# Patient Record
Sex: Female | Born: 1965 | Race: Black or African American | Hispanic: No | Smoking: Never smoker
Health system: Southern US, Community
[De-identification: ages and names within clinical notes are randomized; demographics above are authoritative.]

## PROBLEM LIST (undated history)

## (undated) DIAGNOSIS — J45909 Unspecified asthma, uncomplicated: Secondary | ICD-10-CM

## (undated) DIAGNOSIS — E079 Disorder of thyroid, unspecified: Secondary | ICD-10-CM

## (undated) DIAGNOSIS — D649 Anemia, unspecified: Secondary | ICD-10-CM

## (undated) DIAGNOSIS — E785 Hyperlipidemia, unspecified: Secondary | ICD-10-CM

## (undated) DIAGNOSIS — T7840XA Allergy, unspecified, initial encounter: Secondary | ICD-10-CM

## (undated) DIAGNOSIS — M1990 Unspecified osteoarthritis, unspecified site: Secondary | ICD-10-CM

## (undated) DIAGNOSIS — E119 Type 2 diabetes mellitus without complications: Secondary | ICD-10-CM

## (undated) DIAGNOSIS — G8929 Other chronic pain: Secondary | ICD-10-CM

## (undated) DIAGNOSIS — M25571 Pain in right ankle and joints of right foot: Secondary | ICD-10-CM

## (undated) DIAGNOSIS — I10 Essential (primary) hypertension: Secondary | ICD-10-CM

## (undated) HISTORY — DX: Unspecified osteoarthritis, unspecified site: M19.90

## (undated) HISTORY — DX: Allergy, unspecified, initial encounter: T78.40XA

## (undated) HISTORY — PX: ABDOMINAL HYSTERECTOMY: SHX81

## (undated) HISTORY — DX: Unspecified asthma, uncomplicated: J45.909

## (undated) HISTORY — PX: MYOMECTOMY ABDOMINAL APPROACH: SUR870

## (undated) HISTORY — DX: Other chronic pain: G89.29

## (undated) HISTORY — DX: Essential (primary) hypertension: I10

## (undated) HISTORY — DX: Pain in right ankle and joints of right foot: M25.571

## (undated) HISTORY — DX: Hyperlipidemia, unspecified: E78.5

## (undated) HISTORY — PX: TONSILLECTOMY: SUR1361

## (undated) HISTORY — DX: Disorder of thyroid, unspecified: E07.9

## (undated) HISTORY — DX: Type 2 diabetes mellitus without complications: E11.9

## (undated) HISTORY — DX: Anemia, unspecified: D64.9

---

## 2001-06-05 ENCOUNTER — Emergency Department (HOSPITAL_COMMUNITY): Admission: EM | Admit: 2001-06-05 | Discharge: 2001-06-05 | Payer: Self-pay | Admitting: Emergency Medicine

## 2012-10-18 HISTORY — PX: FOOT SURGERY: SHX648

## 2016-06-23 ENCOUNTER — Ambulatory Visit (INDEPENDENT_AMBULATORY_CARE_PROVIDER_SITE_OTHER): Payer: Federal, State, Local not specified - PPO | Admitting: Nurse Practitioner

## 2016-06-23 ENCOUNTER — Encounter: Payer: Self-pay | Admitting: Nurse Practitioner

## 2016-06-23 ENCOUNTER — Other Ambulatory Visit (INDEPENDENT_AMBULATORY_CARE_PROVIDER_SITE_OTHER): Payer: Federal, State, Local not specified - PPO

## 2016-06-23 VITALS — BP 138/86 | HR 88 | Temp 98.6°F | Ht 68.0 in | Wt 279.0 lb

## 2016-06-23 DIAGNOSIS — E039 Hypothyroidism, unspecified: Secondary | ICD-10-CM

## 2016-06-23 DIAGNOSIS — R6889 Other general symptoms and signs: Secondary | ICD-10-CM

## 2016-06-23 DIAGNOSIS — I10 Essential (primary) hypertension: Secondary | ICD-10-CM | POA: Insufficient documentation

## 2016-06-23 DIAGNOSIS — Z0001 Encounter for general adult medical examination with abnormal findings: Secondary | ICD-10-CM

## 2016-06-23 DIAGNOSIS — J324 Chronic pansinusitis: Secondary | ICD-10-CM

## 2016-06-23 DIAGNOSIS — R350 Frequency of micturition: Secondary | ICD-10-CM | POA: Diagnosis not present

## 2016-06-23 DIAGNOSIS — Z0000 Encounter for general adult medical examination without abnormal findings: Secondary | ICD-10-CM | POA: Diagnosis not present

## 2016-06-23 DIAGNOSIS — E785 Hyperlipidemia, unspecified: Secondary | ICD-10-CM

## 2016-06-23 DIAGNOSIS — J4521 Mild intermittent asthma with (acute) exacerbation: Secondary | ICD-10-CM

## 2016-06-23 DIAGNOSIS — E118 Type 2 diabetes mellitus with unspecified complications: Secondary | ICD-10-CM

## 2016-06-23 DIAGNOSIS — J45901 Unspecified asthma with (acute) exacerbation: Secondary | ICD-10-CM | POA: Insufficient documentation

## 2016-06-23 LAB — CBC WITH DIFFERENTIAL/PLATELET
Basophils Absolute: 0 10*3/uL (ref 0.0–0.1)
Basophils Relative: 0.7 % (ref 0.0–3.0)
EOS PCT: 3.5 % (ref 0.0–5.0)
Eosinophils Absolute: 0.2 10*3/uL (ref 0.0–0.7)
HEMATOCRIT: 41.8 % (ref 36.0–46.0)
Hemoglobin: 14.1 g/dL (ref 12.0–15.0)
LYMPHS ABS: 2 10*3/uL (ref 0.7–4.0)
Lymphocytes Relative: 45.2 % (ref 12.0–46.0)
MCHC: 33.8 g/dL (ref 30.0–36.0)
MCV: 76.7 fl — AB (ref 78.0–100.0)
MONOS PCT: 7 % (ref 3.0–12.0)
Monocytes Absolute: 0.3 10*3/uL (ref 0.1–1.0)
NEUTROS ABS: 1.9 10*3/uL (ref 1.4–7.7)
NEUTROS PCT: 43.6 % (ref 43.0–77.0)
PLATELETS: 290 10*3/uL (ref 150.0–400.0)
RBC: 5.46 Mil/uL — ABNORMAL HIGH (ref 3.87–5.11)
RDW: 14.8 % (ref 11.5–15.5)
WBC: 4.4 10*3/uL (ref 4.0–10.5)

## 2016-06-23 LAB — LIPID PANEL
CHOLESTEROL: 231 mg/dL — AB (ref 0–200)
HDL: 56.9 mg/dL (ref >39.00)
LDL CALC: 149 mg/dL — AB (ref 0–99)
NonHDL: 174.58
TRIGLYCERIDES: 127 mg/dL (ref 0.0–149.0)
Total CHOL/HDL Ratio: 4
VLDL: 25.4 mg/dL (ref 0.0–40.0)

## 2016-06-23 LAB — COMPREHENSIVE METABOLIC PANEL
ALK PHOS: 68 U/L (ref 39–117)
ALT: 24 U/L (ref 0–35)
AST: 15 U/L (ref 0–37)
Albumin: 4.1 g/dL (ref 3.5–5.2)
BILIRUBIN TOTAL: 0.4 mg/dL (ref 0.2–1.2)
BUN: 14 mg/dL (ref 6–23)
CO2: 33 meq/L — AB (ref 19–32)
CREATININE: 0.82 mg/dL (ref 0.40–1.20)
Calcium: 9.7 mg/dL (ref 8.4–10.5)
Chloride: 99 mEq/L (ref 96–112)
GFR: 95.12 mL/min (ref >60.00)
GLUCOSE: 96 mg/dL (ref 70–99)
Potassium: 3.6 mEq/L (ref 3.5–5.1)
SODIUM: 138 meq/L (ref 135–145)
TOTAL PROTEIN: 7.8 g/dL (ref 6.0–8.3)

## 2016-06-23 LAB — POCT URINALYSIS DIPSTICK
Bilirubin, UA: NEGATIVE
GLUCOSE UA: NEGATIVE
Ketones, UA: NEGATIVE
LEUKOCYTES UA: NEGATIVE
NITRITE UA: NEGATIVE
Protein, UA: NEGATIVE
Spec Grav, UA: >=1.03
UROBILINOGEN UA: 0.2
pH, UA: 6

## 2016-06-23 LAB — TSH: TSH: 0.04 u[IU]/mL — ABNORMAL LOW (ref 0.35–4.50)

## 2016-06-23 LAB — HEMOGLOBIN A1C: Hgb A1c MFr Bld: 6.5 % (ref 4.6–6.5)

## 2016-06-23 LAB — T4, FREE: Free T4: 1.44 ng/dL (ref 0.60–1.60)

## 2016-06-23 MED ORDER — AMLODIPINE BESYLATE 5 MG PO TABS: 5.0000 mg | ORAL_TABLET | Freq: Every day | ORAL | 3 refills | Status: DC

## 2016-06-23 MED ORDER — LEVOTHYROXINE SODIUM 175 MCG PO TABS: 175.0000 ug | ORAL_TABLET | Freq: Every day | ORAL | 3 refills | Status: DC

## 2016-06-23 MED ORDER — FLUTICASONE PROPIONATE 50 MCG/ACT NA SUSP: 2.0000 | Freq: Every day | NASAL | 0 refills | Status: DC

## 2016-06-23 MED ORDER — HYDROCHLOROTHIAZIDE 25 MG PO TABS: 25.0000 mg | ORAL_TABLET | Freq: Every day | ORAL | 3 refills | Status: DC

## 2016-06-23 MED ORDER — DOXYCYCLINE HYCLATE 100 MG PO TABS: 100.0000 mg | ORAL_TABLET | Freq: Two times a day (BID) | ORAL | 0 refills | Status: AC

## 2016-06-23 MED ORDER — CETIRIZINE HCL 10 MG PO TABS: 10.0000 mg | ORAL_TABLET | Freq: Every day | ORAL | 0 refills | Status: DC

## 2016-06-23 MED ORDER — ATORVASTATIN CALCIUM 20 MG PO TABS: 20.0000 mg | ORAL_TABLET | Freq: Every day | ORAL | 3 refills | Status: DC

## 2016-06-23 MED ORDER — LISINOPRIL 20 MG PO TABS: 20.0000 mg | ORAL_TABLET | Freq: Every day | ORAL | 3 refills | Status: DC

## 2016-06-23 MED ORDER — GUAIFENESIN ER 600 MG PO TB12: 600.0000 mg | ORAL_TABLET | Freq: Two times a day (BID) | ORAL | 0 refills | Status: DC | PRN

## 2016-06-23 MED ORDER — BENZONATATE 100 MG PO CAPS: 100.0000 mg | ORAL_CAPSULE | Freq: Three times a day (TID) | ORAL | 0 refills | Status: DC | PRN

## 2016-06-23 MED ORDER — ALBUTEROL SULFATE HFA 108 (90 BASE) MCG/ACT IN AERS: 2.0000 | INHALATION_SPRAY | Freq: Four times a day (QID) | RESPIRATORY_TRACT | 2 refills | Status: DC | PRN

## 2016-06-23 NOTE — Progress Notes (Signed)
Subjective:  Patient ID: Emily Zavala, female    DOB: 1966/07/01  Age: 50 y.o. MRN: [id number]  CC: Annual Exam (Pt stated need physical exam and medication refill); Sinusitis (x 3days.); and Urinary Tract Infection   Sinusitis  This is a new problem. The current episode started in the past 7 days. The problem has been rapidly worsening since onset. There has been no fever. Her pain is at a severity of 7/10. The pain is mild. Associated symptoms include chills, congestion, coughing, ear pain and a sore throat. Pertinent negatives include no headaches or shortness of breath. Past treatments include nothing.  Urinary Tract Infection   This is a new problem. The current episode started in the past 7 days. The problem occurs every urination. The problem has been gradually worsening. The quality of the pain is described as burning. The pain is moderate. There has been no fever. She is sexually active. There is no history of pyelonephritis. Associated symptoms include chills and frequency. Pertinent negatives include no discharge, flank pain, hematuria, hesitancy, nausea, urgency or vomiting. There is no history of kidney stones, recurrent UTIs or urinary stasis.   Patient presents today for complete physical.  Immunizations: (TDAP, Hep C screen, Pneumovax, Influenza, zoster): decline influenza and tetanus.  Diet:none  Exercise:none  Home Safety: feels safe at home with husband  Depression screen:Yes - No Depression/anxiety  Weight:  Wt Readings from Last 3 Encounters:  06/23/16 279 lb (126.6 kg)     Pap Smear (every 52yrs for >21-29 without HPV, every 41yrs for >30-3yrs with HPV): up to date per patient, records requested from patient.  Mammogram (yearly, >28yrs): up to date per patient, records requested from patient.  ROS Review of Systems  Constitutional: Positive for chills. Negative for fever, malaise/fatigue and weight loss.  HENT: Positive for congestion, ear discharge, ear  pain and sore throat. Negative for hearing loss.        Hx of recurrent sinusitis, was suppose to get CT sinus recommended by ENT but she moved to [facility].  Eyes:       Negative for visual changes  Respiratory: Positive for cough, sputum production and wheezing. Negative for shortness of breath.   Cardiovascular: Negative for chest pain, palpitations and leg swelling.  Gastrointestinal: Negative for abdominal pain, blood in stool, constipation, diarrhea, heartburn, nausea and vomiting.  Genitourinary: Positive for dysuria and frequency. Negative for flank pain, hematuria, hesitancy and urgency.  Musculoskeletal: Negative for falls, joint pain and myalgias.  Skin: Negative for rash.  Neurological: Negative for dizziness, sensory change and headaches.  Endo/Heme/Allergies: Positive for environmental allergies. Does not bruise/bleed easily.  Psychiatric/Behavioral: Negative for depression, substance abuse and suicidal ideas. The patient is not nervous/anxious.     Objective:  BP 138/86 (BP Location: Left Arm, Patient Position: Sitting, Cuff Size: Large)   Pulse 88   Temp 98.6 F (37 C)   Ht 5\' 8"  (1.727 m)   Wt 279 lb (126.6 kg)   SpO2 98%   BMI 42.42 kg/m   BP Readings from Last 3 Encounters:  06/23/16 138/86    Wt Readings from Last 3 Encounters:  06/23/16 279 lb (126.6 kg)    Physical Exam  Constitutional: She is oriented to person, place, and time. No distress.  HENT:  Right Ear: External ear and ear canal normal. Tympanic membrane is not erythematous. A middle ear effusion is present.  Left Ear: External ear and ear canal normal. Tympanic membrane is not erythematous. A middle ear  effusion is present.  Nose: Mucosal edema and rhinorrhea present.  Mouth/Throat: Uvula is midline. Posterior oropharyngeal erythema present. No oropharyngeal exudate.  Eyes: No scleral icterus.  Neck: Normal range of motion. Neck supple.  Cardiovascular: Normal rate, regular rhythm and normal heart  sounds.   Pulmonary/Chest: Effort normal and breath sounds normal. No respiratory distress.  She declined breast exam  Abdominal: Soft. Bowel sounds are normal. She exhibits no distension. There is no tenderness.  Genitourinary:  Genitourinary Comments: Patient declined  Musculoskeletal: Normal range of motion. She exhibits no edema.  Lymphadenopathy:    She has cervical adenopathy.  Neurological: She is alert and oriented to person, place, and time.  Skin: Skin is warm and dry.  Psychiatric: She has a normal mood and affect. Her behavior is normal.    Lab Results  Component Value Date   WBC 4.4 06/23/2016   HGB 14.1 06/23/2016   HCT 41.8 06/23/2016   PLT 290.0 06/23/2016   GLUCOSE 96 06/23/2016   CHOL 231 (H) 06/23/2016   TRIG 127.0 06/23/2016   HDL 56.90 06/23/2016   LDLCALC 149 (H) 06/23/2016   ALT 24 06/23/2016   AST 15 06/23/2016   NA 138 06/23/2016   K 3.6 06/23/2016   CL 99 06/23/2016   CREATININE 0.82 06/23/2016   BUN 14 06/23/2016   CO2 33 (H) 06/23/2016   TSH 0.04 (L) 06/23/2016   HGBA1C 6.5 06/23/2016    No results found.  Assessment & Plan:   Emily Zavala was seen today for annual exam, sinusitis and urinary tract infection.  Diagnoses and all orders for this visit:  Encounter for preventative adult health care exam with abnormal findings -     CBC w/Diff; Future -     Hemoglobin A1c; Future -     MM Digital Screening; Future  Essential hypertension, benign -     Comprehensive metabolic panel -     amLODipine (NORVASC) 5 MG tablet; Take 1 tablet (5 mg total) by mouth daily. -     lisinopril (PRINIVIL,ZESTRIL) 20 MG tablet; Take 1 tablet (20 mg total) by mouth daily. -     hydrochlorothiazide (HYDRODIURIL) 25 MG tablet; Take 1 tablet (25 mg total) by mouth daily.  Hypothyroidism, unspecified hypothyroidism type -     TSH; Future -     T4, free; Future -     levothyroxine (SYNTHROID, LEVOTHROID) 175 MCG tablet; Take 1 tablet (175 mcg total) by mouth  daily before breakfast.  Hyperlipidemia -     Lipid Profile; Future -     atorvastatin (LIPITOR) 20 MG tablet; Take 1 tablet (20 mg total) by mouth daily.  Chronic pansinusitis -     CT MAXILLOFACIAL WO CONTRAST; Future -     guaiFENesin (MUCINEX) 600 MG 12 hr tablet; Take 1 tablet (600 mg total) by mouth 2 (two) times daily as needed for cough or to loosen phlegm. -     fluticasone (FLONASE) 50 MCG/ACT nasal spray; Place 2 sprays into both nostrils daily. -     cetirizine (ZYRTEC) 10 MG tablet; Take 1 tablet (10 mg total) by mouth daily. -     doxycycline (VIBRA-TABS) 100 MG tablet; Take 1 tablet (100 mg total) by mouth 2 (two) times daily.  Urinary frequency -     POCT Urinalysis Dipstick -     Urine culture; Future  Asthma with acute exacerbation, mild intermittent -     benzonatate (TESSALON) 100 MG capsule; Take 1 capsule (100 mg total) by  mouth 3 (three) times daily as needed for cough. -     albuterol (PROVENTIL HFA;VENTOLIN HFA) 108 (90 Base) MCG/ACT inhaler; Inhale 2 puffs into the lungs every 6 (six) hours as needed for wheezing or shortness of breath.  Type 2 diabetes mellitus with complication, without long-term current use of insulin (HCC) -     metformin (FORTAMET) 500 MG (OSM) 24 hr tablet; Take 1 tablet (500 mg total) by mouth 2 (two) times daily with a meal.   I have changed Ms. Campo's amLODipine, lisinopril, levothyroxine, hydrochlorothiazide, and atorvastatin. I am also having her start on guaiFENesin, fluticasone, cetirizine, doxycycline, benzonatate, albuterol, and metformin.  Meds ordered this encounter  Medications  . DISCONTD: atorvastatin (LIPITOR) 20 MG tablet    Sig: Take 20 mg by mouth daily.  Marland Kitchen DISCONTD: lisinopril (PRINIVIL,ZESTRIL) 20 MG tablet    Sig: Take 20 mg by mouth daily.  Marland Kitchen DISCONTD: levothyroxine (SYNTHROID, LEVOTHROID) 175 MCG tablet    Sig: Take 175 mcg by mouth daily before breakfast.  . DISCONTD: hydrochlorothiazide (HYDRODIURIL) 25 MG  tablet    Sig: Take 25 mg by mouth daily.  Marland Kitchen DISCONTD: amLODipine (NORVASC) 5 MG tablet    Sig: Take 5 mg by mouth daily.  Marland Kitchen guaiFENesin (MUCINEX) 600 MG 12 hr tablet    Sig: Take 1 tablet (600 mg total) by mouth 2 (two) times daily as needed for cough or to loosen phlegm.    Dispense:  14 tablet    Refill:  0    Order Specific Question:   Supervising Provider    Answer:   Tresa Garter [[id number]]  . fluticasone (FLONASE) 50 MCG/ACT nasal spray    Sig: Place 2 sprays into both nostrils daily.    Dispense:  16 g    Refill:  0    Order Specific Question:   Supervising Provider    Answer:   Tresa Garter [[id number]]  . cetirizine (ZYRTEC) 10 MG tablet    Sig: Take 1 tablet (10 mg total) by mouth daily.    Dispense:  30 tablet    Refill:  0    Order Specific Question:   Supervising Provider    Answer:   Tresa Garter [[id number]]  . doxycycline (VIBRA-TABS) 100 MG tablet    Sig: Take 1 tablet (100 mg total) by mouth 2 (two) times daily.    Dispense:  14 tablet    Refill:  0    Order Specific Question:   Supervising Provider    Answer:   Tresa Garter [[id number]]  . amLODipine (NORVASC) 5 MG tablet    Sig: Take 1 tablet (5 mg total) by mouth daily.    Dispense:  30 tablet    Refill:  3    Order Specific Question:   Supervising Provider    Answer:   Tresa Garter [[id number]]  . lisinopril (PRINIVIL,ZESTRIL) 20 MG tablet    Sig: Take 1 tablet (20 mg total) by mouth daily.    Dispense:  30 tablet    Refill:  3    Order Specific Question:   Supervising Provider    Answer:   Tresa Garter [[id number]]  . levothyroxine (SYNTHROID, LEVOTHROID) 175 MCG tablet    Sig: Take 1 tablet (175 mcg total) by mouth daily before breakfast.    Dispense:  30 tablet    Refill:  3    Order Specific Question:   Supervising Provider    Answer:   Tresa Garter [  [id number]]  . hydrochlorothiazide (HYDRODIURIL) 25 MG tablet    Sig: Take 1 tablet (25 mg total) by mouth daily.    Dispense:   30 tablet    Refill:  3    Order Specific Question:   Supervising Provider    Answer:   Tresa Garter [[id number]]  . atorvastatin (LIPITOR) 20 MG tablet    Sig: Take 1 tablet (20 mg total) by mouth daily.    Dispense:  30 tablet    Refill:  3    Order Specific Question:   Supervising Provider    Answer:   Tresa Garter [[id number]]  . benzonatate (TESSALON) 100 MG capsule    Sig: Take 1 capsule (100 mg total) by mouth 3 (three) times daily as needed for cough.    Dispense:  20 capsule    Refill:  0    Order Specific Question:   Supervising Provider    Answer:   Tresa Garter [[id number]]  . albuterol (PROVENTIL HFA;VENTOLIN HFA) 108 (90 Base) MCG/ACT inhaler    Sig: Inhale 2 puffs into the lungs every 6 (six) hours as needed for wheezing or shortness of breath.    Dispense:  1 Inhaler    Refill:  2    Order Specific Question:   Supervising Provider    Answer:   Tresa Garter [[id number]]  . metformin (FORTAMET) 500 MG (OSM) 24 hr tablet    Sig: Take 1 tablet (500 mg total) by mouth 2 (two) times daily with a meal.    Dispense:  60 tablet    Refill:  3    Order Specific Question:   Supervising Provider    Answer:   Tresa Garter [[id number]]   Recent Results (from the past 2160 hour(s))  Lipid Profile     Status: Abnormal   Collection Time: 06/23/16 12:43 PM  Result Value Ref Range   Cholesterol 231 (H) 0 - 200 mg/dL    Comment: ATP III Classification       Desirable:  < 200 mg/dL               Borderline High:  200 - 239 mg/dL          High:  > = 409 mg/dL   Triglycerides 811.9 0.0 - 149.0 mg/dL    Comment: Normal:  <147 mg/dLBorderline High:  150 - 199 mg/dL   HDL 82.95 >62.13 mg/dL   VLDL 08.6 0.0 - 57.8 mg/dL   LDL Cholesterol 469 (H) 0 - 99 mg/dL   Total CHOL/HDL Ratio 4     Comment:                Men          Women1/2 Average Risk     3.4          3.3Average Risk          5.0          4.42X Average Risk          9.6          7.13X Average Risk          15.0           11.0                       NonHDL 174.58     Comment: NOTE:  Non-HDL goal should be 30 mg/dL higher than patient's LDL goal (i.e. LDL goal  of < 70 mg/dL, would have non-HDL goal of < 100 mg/dL)  TSH     Status: Abnormal   Collection Time: 06/23/16 12:43 PM  Result Value Ref Range   TSH 0.04 (L) 0.35 - 4.50 uIU/mL  T4, free     Status: None   Collection Time: 06/23/16 12:43 PM  Result Value Ref Range   Free T4 1.44 0.60 - 1.60 ng/dL  CBC w/Diff     Status: Abnormal   Collection Time: 06/23/16 12:43 PM  Result Value Ref Range   WBC 4.4 4.0 - 10.5 K/uL   RBC 5.46 (H) 3.87 - 5.11 Mil/uL   Hemoglobin 14.1 12.0 - 15.0 g/dL   HCT 03.441.8 74.236.0 - 59.546.0 %   MCV 76.7 (L) 78.0 - 100.0 fl   MCHC 33.8 30.0 - 36.0 g/dL   RDW 63.814.8 75.611.5 - 43.315.5 %   Platelets 290.0 150.0 - 400.0 K/uL   Neutrophils Relative % 43.6 43.0 - 77.0 %   Lymphocytes Relative 45.2 12.0 - 46.0 %   Monocytes Relative 7.0 3.0 - 12.0 %   Eosinophils Relative 3.5 0.0 - 5.0 %   Basophils Relative 0.7 0.0 - 3.0 %   Neutro Abs 1.9 1.4 - 7.7 K/uL   Lymphs Abs 2.0 0.7 - 4.0 K/uL   Monocytes Absolute 0.3 0.1 - 1.0 K/uL   Eosinophils Absolute 0.2 0.0 - 0.7 K/uL   Basophils Absolute 0.0 0.0 - 0.1 K/uL  Hemoglobin A1c     Status: None   Collection Time: 06/23/16 12:43 PM  Result Value Ref Range   Hgb A1c MFr Bld 6.5 4.6 - 6.5 %    Comment: Glycemic Control Guidelines for People with Diabetes:Non Diabetic:  <6%Goal of Therapy: <7%Additional Action Suggested:  >8%   Comprehensive metabolic panel     Status: Abnormal   Collection Time: 06/23/16 12:43 PM  Result Value Ref Range   Sodium 138 135 - 145 mEq/L   Potassium 3.6 3.5 - 5.1 mEq/L   Chloride 99 96 - 112 mEq/L   CO2 33 (H) 19 - 32 mEq/L   Glucose, Bld 96 70 - 99 mg/dL   BUN 14 6 - 23 mg/dL   Creatinine, Ser 2.950.82 0.40 - 1.20 mg/dL   Total Bilirubin 0.4 0.2 - 1.2 mg/dL   Alkaline Phosphatase 68 39 - 117 U/L   AST 15 0 - 37 U/L   ALT 24 0 - 35 U/L   Total Protein 7.8 6.0 - 8.3  g/dL   Albumin 4.1 3.5 - 5.2 g/dL   Calcium 9.7 8.4 - 18.810.5 mg/dL   GFR 41.6695.12 >06.30>60.00 mL/min  POCT Urinalysis Dipstick     Status: Abnormal   Collection Time: 06/23/16  1:41 PM  Result Value Ref Range   Color, UA yellow    Clarity, UA cloudy    Glucose, UA negative    Bilirubin, UA negative    Ketones, UA negative    Spec Grav, UA >=1.030    Blood, UA 2+    80Ery/uL    pH, UA 6.0    Protein, UA negative    Urobilinogen, UA 0.2    Nitrite, UA negative    Leukocytes, UA Negative Negative   Follow-up: Return in about 3 months (around 09/22/2016) for chronic conditions.  Alysia Pennaharlotte Isayah Ignasiak, NP

## 2016-06-23 NOTE — Progress Notes (Signed)
Pre visit review using our clinic review tool, if applicable. No additional management support is needed unless otherwise documented below in the visit note. 

## 2016-06-23 NOTE — Progress Notes (Signed)
Reviewed with patient in office. Urine culture sent

## 2016-06-24 DIAGNOSIS — E119 Type 2 diabetes mellitus without complications: Secondary | ICD-10-CM | POA: Insufficient documentation

## 2016-06-24 MED ORDER — METFORMIN HCL ER (OSM) 500 MG PO TB24: 500.0000 mg | ORAL_TABLET | Freq: Two times a day (BID) | ORAL | 3 refills | Status: DC

## 2016-06-24 NOTE — Assessment & Plan Note (Addendum)
HgbA1c of 6.5 Start metformin XR 500mg  BID. F/up in 3months.

## 2016-06-25 LAB — URINE CULTURE

## 2016-06-27 ENCOUNTER — Other Ambulatory Visit: Payer: Federal, State, Local not specified - PPO

## 2016-07-05 ENCOUNTER — Telehealth: Payer: Self-pay | Admitting: *Deleted

## 2016-07-05 DIAGNOSIS — I10 Essential (primary) hypertension: Secondary | ICD-10-CM

## 2016-07-05 MED ORDER — LISINOPRIL 20 MG PO TABS: 20.0000 mg | ORAL_TABLET | Freq: Two times a day (BID) | ORAL | 3 refills | Status: DC

## 2016-07-05 NOTE — Telephone Encounter (Signed)
Rec'd call pt states when she was in for her appt she inform nurse that she takes Lisinopril 20 mg twice a day, but when script was sent in it was only for 1 a day, and now she is out because she been taking 2 as she been. Req new rx to be sent to pharmacy. Verified pharmacy inform pt will send to [facility]...Raechel Chute/lmb

## 2016-07-12 ENCOUNTER — Telehealth: Payer: Self-pay | Admitting: Nurse Practitioner

## 2016-07-12 NOTE — Telephone Encounter (Signed)
Please mark diabetic eye exam and foot exam N/A on the patients reminders list. She is not a diabetic

## 2016-07-20 ENCOUNTER — Telehealth: Payer: Self-pay | Admitting: Nurse Practitioner

## 2016-07-20 NOTE — Telephone Encounter (Signed)
Rec'd from [facility] forward 3 pages to Dr.Nche

## 2016-08-09 ENCOUNTER — Encounter: Payer: Self-pay | Admitting: *Deleted

## 2016-08-09 NOTE — Telephone Encounter (Signed)
Sent My Chart messege explaining A1c 6.5 is diabetes and pt supposed start Rx. Metformin and call back if any question.

## 2016-08-22 ENCOUNTER — Encounter: Payer: Self-pay | Admitting: Nurse Practitioner

## 2016-08-22 DIAGNOSIS — G8929 Other chronic pain: Secondary | ICD-10-CM

## 2016-08-22 DIAGNOSIS — M25571 Pain in right ankle and joints of right foot: Secondary | ICD-10-CM

## 2016-08-22 HISTORY — DX: Other chronic pain: G89.29

## 2016-10-13 ENCOUNTER — Other Ambulatory Visit: Payer: Self-pay | Admitting: Nurse Practitioner

## 2016-10-13 DIAGNOSIS — E785 Hyperlipidemia, unspecified: Secondary | ICD-10-CM

## 2016-10-13 DIAGNOSIS — I10 Essential (primary) hypertension: Secondary | ICD-10-CM

## 2016-11-04 ENCOUNTER — Ambulatory Visit: Payer: Federal, State, Local not specified - PPO | Admitting: Nurse Practitioner

## 2016-11-10 ENCOUNTER — Other Ambulatory Visit: Payer: Self-pay | Admitting: Nurse Practitioner

## 2016-11-10 DIAGNOSIS — I10 Essential (primary) hypertension: Secondary | ICD-10-CM

## 2016-11-30 ENCOUNTER — Other Ambulatory Visit: Payer: Self-pay | Admitting: Nurse Practitioner

## 2016-11-30 DIAGNOSIS — E039 Hypothyroidism, unspecified: Secondary | ICD-10-CM

## 2016-12-03 ENCOUNTER — Emergency Department (HOSPITAL_COMMUNITY): Payer: Federal, State, Local not specified - PPO

## 2016-12-03 ENCOUNTER — Encounter (HOSPITAL_COMMUNITY): Payer: Self-pay | Admitting: Emergency Medicine

## 2016-12-03 ENCOUNTER — Other Ambulatory Visit: Payer: Self-pay | Admitting: Family Medicine

## 2016-12-03 ENCOUNTER — Emergency Department (HOSPITAL_COMMUNITY)
Admission: EM | Admit: 2016-12-03 | Discharge: 2016-12-03 | Disposition: A | Discharge status: Home / Self Care | Payer: Federal, State, Local not specified - PPO | Attending: Emergency Medicine | Admitting: Emergency Medicine

## 2016-12-03 ENCOUNTER — Ambulatory Visit (INDEPENDENT_AMBULATORY_CARE_PROVIDER_SITE_OTHER): Payer: Federal, State, Local not specified - PPO | Admitting: Family Medicine

## 2016-12-03 ENCOUNTER — Encounter: Payer: Self-pay | Admitting: Family Medicine

## 2016-12-03 ENCOUNTER — Telehealth: Payer: Self-pay | Admitting: Nurse Practitioner

## 2016-12-03 VITALS — BP 130/90 | HR 87 | Temp 98.6°F | Resp 18 | Ht 68.0 in | Wt 295.0 lb

## 2016-12-03 DIAGNOSIS — R05 Cough: Secondary | ICD-10-CM | POA: Diagnosis not present

## 2016-12-03 DIAGNOSIS — I10 Essential (primary) hypertension: Secondary | ICD-10-CM | POA: Diagnosis not present

## 2016-12-03 DIAGNOSIS — J069 Acute upper respiratory infection, unspecified: Secondary | ICD-10-CM | POA: Diagnosis not present

## 2016-12-03 DIAGNOSIS — Z7984 Long term (current) use of oral hypoglycemic drugs: Secondary | ICD-10-CM | POA: Diagnosis not present

## 2016-12-03 DIAGNOSIS — R059 Cough, unspecified: Secondary | ICD-10-CM

## 2016-12-03 DIAGNOSIS — J45909 Unspecified asthma, uncomplicated: Secondary | ICD-10-CM | POA: Diagnosis not present

## 2016-12-03 DIAGNOSIS — E119 Type 2 diabetes mellitus without complications: Secondary | ICD-10-CM | POA: Diagnosis not present

## 2016-12-03 DIAGNOSIS — Z79899 Other long term (current) drug therapy: Secondary | ICD-10-CM | POA: Diagnosis not present

## 2016-12-03 DIAGNOSIS — E039 Hypothyroidism, unspecified: Secondary | ICD-10-CM

## 2016-12-03 DIAGNOSIS — J40 Bronchitis, not specified as acute or chronic: Secondary | ICD-10-CM | POA: Diagnosis not present

## 2016-12-03 DIAGNOSIS — E118 Type 2 diabetes mellitus with unspecified complications: Secondary | ICD-10-CM

## 2016-12-03 LAB — CBC
HEMATOCRIT: 42.7 % (ref 36.0–46.0)
HEMOGLOBIN: 13.6 g/dL (ref 12.0–15.0)
MCH: 25 pg — AB (ref 26.0–34.0)
MCHC: 31.9 g/dL (ref 30.0–36.0)
MCV: 78.3 fL (ref 78.0–100.0)
Platelets: 272 10*3/uL (ref 150–400)
RBC: 5.45 MIL/uL — AB (ref 3.87–5.11)
RDW: 16 % — ABNORMAL HIGH (ref 11.5–15.5)
WBC: 5 10*3/uL (ref 4.0–10.5)

## 2016-12-03 LAB — BASIC METABOLIC PANEL
Anion gap: 7 (ref 5–15)
BUN: 12 mg/dL (ref 6–20)
CHLORIDE: 100 mmol/L — AB (ref 101–111)
CO2: 32 mmol/L (ref 22–32)
Calcium: 9.5 mg/dL (ref 8.9–10.3)
Creatinine, Ser: 0.82 mg/dL (ref 0.44–1.00)
GFR calc Af Amer: >60 mL/min (ref >60)
GFR calc non Af Amer: >60 mL/min (ref >60)
Glucose, Bld: 123 mg/dL — ABNORMAL HIGH (ref 65–99)
POTASSIUM: 3.5 mmol/L (ref 3.5–5.1)
SODIUM: 139 mmol/L (ref 135–145)

## 2016-12-03 MED ORDER — PREDNISONE 20 MG PO TABS: 40.0000 mg | ORAL_TABLET | Freq: Every day | ORAL | 0 refills | Status: DC

## 2016-12-03 MED ORDER — DOXYCYCLINE HYCLATE 100 MG PO TABS: 100.0000 mg | ORAL_TABLET | Freq: Two times a day (BID) | ORAL | 0 refills | Status: DC

## 2016-12-03 MED ORDER — LISINOPRIL 20 MG PO TABS: 20.0000 mg | ORAL_TABLET | Freq: Two times a day (BID) | ORAL | 0 refills | Status: DC

## 2016-12-03 MED ORDER — HYDROCHLOROTHIAZIDE 25 MG PO TABS: 25.0000 mg | ORAL_TABLET | Freq: Every day | ORAL | 0 refills | Status: DC

## 2016-12-03 MED ORDER — ALBUTEROL SULFATE HFA 108 (90 BASE) MCG/ACT IN AERS: 2.0000 | INHALATION_SPRAY | Freq: Four times a day (QID) | RESPIRATORY_TRACT | 2 refills | Status: DC | PRN

## 2016-12-03 NOTE — Telephone Encounter (Signed)
Pt was seen on 3.3.2018 at [facility]; pt needed [facility] [address] for a 90 day refill. Pt would also like to switch to the Brand name Synthroid; pt advised a follow up appointment may be needed.

## 2016-12-03 NOTE — ED Triage Notes (Signed)
Per pt, states was seen by PCP today-states productive cough for 3 days-chest soreness from coughing-states blood tinged sputum this am-was told to come to ED for further eval

## 2016-12-03 NOTE — Discharge Instructions (Signed)
Please continue the antibiotics and medications prescribed by your doctor, the laboratory tests and chest x-ray were reassuring today.

## 2016-12-03 NOTE — Progress Notes (Signed)
Pre visit review using our clinic review tool, if applicable. No additional management support is needed unless otherwise documented below in the visit note. 

## 2016-12-03 NOTE — Assessment & Plan Note (Signed)
Suspect bronchitis given history and exam. Likely reactive airway component. Given wheezing and coarse breath sounds we'll treat with prednisone. Given purulent productive sputum on cough we will treat with doxycycline. We will obtain a chest x-ray. I did offer nebulizer treatment though she deferred. We will send in a albuterol inhaler. Patient additionally noted she was out of her blood pressure medications. I advised that the purpose of today's clinic was for acute complaints and that I would only send in enough to cover her until her PCP can refill these. It appears that she had lab work less than 6 months ago. She's given return precautions.

## 2016-12-03 NOTE — Progress Notes (Signed)
Marikay Alar, MD Phone: [phone]  Emily Zavala is a 51 y.o. female who presents today for same-day visit.  Patient notes over the last couple of days she has developed cough and chest congestion. She notes she has been sneezing to some degree as well. She is coughing up yellow and green mucus. A couple of times there were a few small tinges of blood though no gross hemoptysis. She's felt hot and had chills. She notes chronically her right nostril stays closed. She notes no shortness of breath. Notes mild soreness in the right side of her chest with coughing. She's tried Coricidin and Flonase and saltwater rinses. These have not been significantly beneficial. She notes no chest pain. No history of blood clot. No leg swelling.  PMH: nonsmoker.   ROS see history of present illness  Objective  Physical Exam Vitals:   12/03/16 1016  BP: 130/90  Pulse: 87  Resp: 18  Temp: 98.6 F (37 C)    BP Readings from Last 3 Encounters:  12/03/16 150/86  12/03/16 130/90  06/23/16 138/86   Wt Readings from Last 3 Encounters:  12/03/16 295 lb (133.8 kg)  06/23/16 279 lb (126.6 kg)    Physical Exam  Constitutional: No distress.  HENT:  Head: Normocephalic and atraumatic.  Mouth/Throat: Oropharynx is clear and moist. No oropharyngeal exudate.  Eyes: Conjunctivae are normal. Pupils are equal, round, and reactive to light.  Cardiovascular: Normal rate, regular rhythm and normal heart sounds.   Pulmonary/Chest: Effort normal. No respiratory distress. She exhibits tenderness (right-sided chest tenderness on exam with chaperone.).  Scattered coarse breath sounds and scattered mild wheezes with prolonged expiratory phase  Neurological: She is alert. Gait normal.  Skin: Skin is warm and dry. She is not diaphoretic.     Assessment/Plan: Please see individual problem list.  Bronchitis Suspect bronchitis given history and exam. Likely reactive airway component. Given wheezing and  coarse breath sounds we'll treat with prednisone. Given purulent productive sputum on cough we will treat with doxycycline. We will obtain a chest x-ray. I did offer nebulizer treatment though she deferred. We will send in a albuterol inhaler. Patient additionally noted she was out of her blood pressure medications. I advised that the purpose of today's clinic was for acute complaints and that I would only send in enough to cover her until her PCP can refill these. It appears that she had lab work less than 6 months ago. She's given return precautions.   Orders Placed This Encounter  Procedures  . DG Chest 2 View    Standing Status:   Future    Standing Expiration Date:   02/02/2018    Order Specific Question:   Reason for Exam (SYMPTOM  OR DIAGNOSIS REQUIRED)    Answer:   cough, chest congestion, small tinge of blood to productive green sputum    Order Specific Question:   Is patient pregnant?    Answer:   No    Order Specific Question:   Preferred imaging location?    Answer:   [facility]    Meds ordered this encounter  Medications  . albuterol (PROVENTIL HFA;VENTOLIN HFA) 108 (90 Base) MCG/ACT inhaler    Sig: Inhale 2 puffs into the lungs every 6 (six) hours as needed for wheezing or shortness of breath.    Dispense:  1 Inhaler    Refill:  2  . predniSONE (DELTASONE) 20 MG tablet    Sig: Take 2 tablets (40 mg total) by mouth daily  with breakfast.    Dispense:  10 tablet    Refill:  0  . doxycycline (VIBRA-TABS) 100 MG tablet    Sig: Take 1 tablet (100 mg total) by mouth 2 (two) times daily.    Dispense:  14 tablet    Refill:  0  . lisinopril (PRINIVIL,ZESTRIL) 20 MG tablet    Sig: Take 1 tablet (20 mg total) by mouth 2 (two) times daily.    Dispense:  6 tablet    Refill:  0  . hydrochlorothiazide (HYDRODIURIL) 25 MG tablet    Sig: Take 1 tablet (25 mg total) by mouth daily.    Dispense:  3 tablet    Refill:  0    **Patient requests 90 days supply**    Marikay AlarEric  Sonnenberg, MD [facility]

## 2016-12-03 NOTE — ED Notes (Signed)
Pt declined d/c vitals. She states that she needs to go.

## 2016-12-03 NOTE — ED Provider Notes (Signed)
[facility]-EMERGENCY DEPT Provider Note   CSN: [id number] Arrival date & time: 12/03/16  1059     History   Chief Complaint Chief Complaint  Patient presents with  . Cough    HPI Emily Zavala is a 51 y.o. female.  HPI Pt was coughing up mucus the last several months.  In the last few days it has been getting worse.  SHe has been coughing up brown, green , mucus and noticed some blood.   She went to see her doctor today.  She was told to come to the ED to get a CXR.   Last night she felt warm, she did not take a temperature.   No leg swelling.   No hx of PE, DVT or CAD.   Past Medical History:  Diagnosis Date  . Allergy   . Asthma    stable without any medication.  . Chronic pain of right ankle 08/22/2016  . Hypertension   . Thyroid disease     Patient Active Problem List   Diagnosis Date Noted  . Chronic pain of right ankle 08/22/2016  . Diabetes mellitus (HCC) 06/24/2016  . Essential hypertension, benign 06/23/2016  . Hypothyroidism 06/23/2016  . Hyperlipidemia 06/23/2016  . Asthma with acute exacerbation 06/23/2016    Past Surgical History:  Procedure Laterality Date  . ABDOMINAL HYSTERECTOMY     uterus and cervix removed, still has ovaries, 01/2010  . FOOT SURGERY    . MYOMECTOMY ABDOMINAL APPROACH     10/1998  . TONSILLECTOMY     1983    OB History    No data available       Home Medications    Prior to Admission medications   Medication Sig Start Date End Date Taking? Authorizing Provider  albuterol (PROVENTIL HFA;VENTOLIN HFA) 108 (90 Base) MCG/ACT inhaler Inhale 2 puffs into the lungs every 6 (six) hours as needed for wheezing or shortness of breath. 12/03/16   Glori Luis, MD  amLODipine (NORVASC) 5 MG tablet TAKE 1 TABLET BY MOUTH DAILY 10/14/16   Anne Ng, NP  atorvastatin (LIPITOR) 20 MG tablet TAKE 1 TABLET BY MOUTH DAILY 10/14/16   Anne Ng, NP  cetirizine (ZYRTEC) 10 MG tablet Take 1 tablet (10 mg total) by mouth  daily. 06/23/16   Anne Ng, NP  doxycycline (VIBRA-TABS) 100 MG tablet Take 1 tablet (100 mg total) by mouth 2 (two) times daily. 12/03/16   Glori Luis, MD  fluticasone (FLONASE) 50 MCG/ACT nasal spray Place 2 sprays into both nostrils daily. 06/23/16   Anne Ng, NP  guaiFENesin (MUCINEX) 600 MG 12 hr tablet Take 1 tablet (600 mg total) by mouth 2 (two) times daily as needed for cough or to loosen phlegm. 06/23/16   Anne Ng, NP  hydrochlorothiazide (HYDRODIURIL) 25 MG tablet Take 1 tablet (25 mg total) by mouth daily. 12/03/16   Glori Luis, MD  levothyroxine (SYNTHROID, LEVOTHROID) 175 MCG tablet Take 1 tablet (175 mcg total) by mouth daily before breakfast. 06/23/16   Anne Ng, NP  lisinopril (PRINIVIL,ZESTRIL) 20 MG tablet Take 1 tablet (20 mg total) by mouth 2 (two) times daily. 12/03/16   Glori Luis, MD  metformin (FORTAMET) 500 MG (OSM) 24 hr tablet Take 1 tablet (500 mg total) by mouth 2 (two) times daily with a meal. 06/24/16   Anne Ng, NP  predniSONE (DELTASONE) 20 MG tablet Take 2 tablets (40 mg total) by mouth daily with breakfast.  12/03/16   Glori LuisEric G Sonnenberg, MD    Family History Family History  Problem Relation Age of Onset  . Hypertension Mother   . Cancer Mother     breast  . Diabetes Mother   . Dementia Mother   . CAD Mother     s/p CABG  . Diabetes Father   . Cancer Father     unknown  . Mental illness Father   . Cancer Maternal Grandmother   . Stroke Maternal Grandmother     Social History Social History  Substance Use Topics  . Smoking status: Never Smoker  . Smokeless tobacco: Never Used  . Alcohol use No     Allergies   Levaquin [levofloxacin in d5w] and Penicillins   Review of Systems Review of Systems  All other systems reviewed and are negative.    Physical Exam Updated Vital Signs BP 150/86 (BP Location: Left Arm)   Pulse 75   Temp 98.3 F (36.8 C) (Oral)   Resp 18   SpO2 100%     Physical Exam  Constitutional: She appears well-developed and well-nourished. No distress.  HENT:  Head: Normocephalic and atraumatic.  Right Ear: External ear normal.  Left Ear: External ear normal.  Eyes: Conjunctivae are normal. Right eye exhibits no discharge. Left eye exhibits no discharge. No scleral icterus.  Neck: Neck supple. No tracheal deviation present.  Cardiovascular: Normal rate, regular rhythm and intact distal pulses.   Pulmonary/Chest: Effort normal and breath sounds normal. No stridor. No respiratory distress. She has no wheezes. She has no rales.  Abdominal: Soft. Bowel sounds are normal. She exhibits no distension. There is no tenderness. There is no rebound and no guarding.  Musculoskeletal: She exhibits no edema or tenderness.  Neurological: She is alert. She has normal strength. No cranial nerve deficit (no facial droop, extraocular movements intact, no slurred speech) or sensory deficit. She exhibits normal muscle tone. She displays no seizure activity. Coordination normal.  Skin: Skin is warm and dry. No rash noted.  Psychiatric: She has a normal mood and affect.  Nursing note and vitals reviewed.    ED Treatments / Results  Labs (all labs ordered are listed, but only abnormal results are displayed) Labs Reviewed  CBC - Abnormal; Notable for the following:       Result Value   RBC 5.45 (*)    MCH 25.0 (*)    RDW 16.0 (*)    All other components within normal limits  BASIC METABOLIC PANEL - Abnormal; Notable for the following:    Chloride 100 (*)    Glucose, Bld 123 (*)    All other components within normal limits    EKG  EKG Interpretation None       Radiology Dg Chest 2 View  Result Date: 12/03/2016 CLINICAL DATA:  Pt states she has had a productive cough. Pt also states she has right side chest pain. No other chest complaints. PT HX: asthma, HTN EXAM: CHEST  2 VIEW COMPARISON:  None. FINDINGS: The heart size and mediastinal contours are  within normal limits. Both lungs are clear. The visualized skeletal structures are unremarkable. IMPRESSION: No active cardiopulmonary disease. Electronically Signed   By: Norva PavlovElizabeth  Brown M.D.   On: 12/03/2016 11:42    Procedures Procedures (including critical care time)  Medications Ordered in ED Medications - No data to display   Initial Impression / Assessment and Plan / ED Course  I have reviewed the triage vital signs and the nursing notes.  Pertinent labs & imaging results that were available during my care of the patient were reviewed by me and considered in my medical decision making (see chart for details).  Clinical Course as of Dec 03 1304  Sat Dec 03, 2016  1124 I reviewed the notes from the doctor's office. They are not completeyet however it looks like she was given prescriptions for albuterol, doxycycline there is also an order for a chest x-ray. I wonder if the patient was supposed to go for an outpatient chest x-ray but she is certain that her doctor told her to come to the emergency room.  [JK]    Clinical Course User Index [JK] Linwood Dibbles, MD   Patient's laboratory tests and chest x-ray today are reassuring.  She was given prescriptions by her primary care doctor today.  She has them to pick up at the pharmacy.  At this time there does not appear to be any evidence of an acute emergency medical condition and the patient appears stable for discharge with appropriate outpatient follow up.   Final Clinical Impressions(s) / ED Diagnoses   Final diagnoses:  Cough  Acute upper respiratory infection    New Prescriptions New Prescriptions   No medications on file     Linwood Dibbles, MD 12/03/16 1307

## 2016-12-03 NOTE — Patient Instructions (Addendum)
Nice to meet you. We will treat you for bronchitis with doxycycline, prednisone, and albuterol. You should use the albuterol every 6 hours for the next 2 days and then as needed.  If you develop shortness of breath, cough productive of blood, fevers, chest pain, or any new or change in symptoms please seek medical attention.

## 2016-12-05 NOTE — Telephone Encounter (Signed)
Spoke with pt--inform Emily Zavala response below--pt req to speak to [name] personally if it is possible.   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 Pt stated she is waiting for his disability check to come in (sometime next month) before she knows when she can make an appt. Lisinopril is the one she need the most.

## 2016-12-05 NOTE — Telephone Encounter (Signed)
FYI  Pt aware of Emily Zavala's response. Pt stated her leg is hurt so she is unable to drive yet, but she will get it done as soon as she can drive.   Pt also stated she had her BMP done at the hospital (not sure if Claris GowerCharlotte wants to repeat that?

## 2016-12-05 NOTE — Telephone Encounter (Signed)
Please advise patient that her office visit on Saturday does not count towards her appts with me. She needs to be seen in office by me in order to get further refills. Thank you

## 2016-12-05 NOTE — Telephone Encounter (Signed)
Needs to repeat labs as ordered. Thank you

## 2016-12-19 ENCOUNTER — Other Ambulatory Visit (INDEPENDENT_AMBULATORY_CARE_PROVIDER_SITE_OTHER): Payer: Federal, State, Local not specified - PPO

## 2016-12-19 DIAGNOSIS — I10 Essential (primary) hypertension: Secondary | ICD-10-CM | POA: Diagnosis not present

## 2016-12-19 DIAGNOSIS — E118 Type 2 diabetes mellitus with unspecified complications: Secondary | ICD-10-CM | POA: Diagnosis not present

## 2016-12-19 DIAGNOSIS — E039 Hypothyroidism, unspecified: Secondary | ICD-10-CM

## 2016-12-19 LAB — BASIC METABOLIC PANEL
BUN: 11 mg/dL (ref 6–23)
CALCIUM: 9.9 mg/dL (ref 8.4–10.5)
CO2: 32 meq/L (ref 19–32)
Chloride: 102 mEq/L (ref 96–112)
Creatinine, Ser: 0.84 mg/dL (ref 0.40–1.20)
GFR: 92.32 mL/min (ref >60.00)
Glucose, Bld: 114 mg/dL — ABNORMAL HIGH (ref 70–99)
Potassium: 3.8 mEq/L (ref 3.5–5.1)
Sodium: 139 mEq/L (ref 135–145)

## 2016-12-19 LAB — HEMOGLOBIN A1C: HEMOGLOBIN A1C: 7.1 % — AB (ref 4.6–6.5)

## 2016-12-19 LAB — TSH: TSH: 2.42 u[IU]/mL (ref 0.35–4.50)

## 2016-12-19 LAB — T4, FREE: FREE T4: 0.73 ng/dL (ref 0.60–1.60)

## 2016-12-20 MED ORDER — HYDROCHLOROTHIAZIDE 25 MG PO TABS: 25.0000 mg | ORAL_TABLET | Freq: Every day | ORAL | 0 refills | Status: DC

## 2016-12-20 MED ORDER — LEVOTHYROXINE SODIUM 175 MCG PO TABS: 175.0000 ug | ORAL_TABLET | Freq: Every day | ORAL | 0 refills | Status: DC

## 2016-12-20 MED ORDER — LISINOPRIL 20 MG PO TABS: 20.0000 mg | ORAL_TABLET | Freq: Two times a day (BID) | ORAL | 0 refills | Status: DC

## 2016-12-20 MED ORDER — METFORMIN HCL ER (OSM) 500 MG PO TB24: 500.0000 mg | ORAL_TABLET | Freq: Two times a day (BID) | ORAL | 0 refills | Status: DC

## 2016-12-20 NOTE — Progress Notes (Signed)
Refilled medications for another 3months. It is imperative that she makes an appt in 2-443months prior to running out of medications again. She will need an OV prior to getting additional refills.

## 2016-12-21 ENCOUNTER — Telehealth: Payer: Self-pay | Admitting: Nurse Practitioner

## 2016-12-21 MED ORDER — METFORMIN HCL ER 500 MG PO TB24: 500.0000 mg | ORAL_TABLET | Freq: Two times a day (BID) | ORAL | 0 refills | Status: DC

## 2016-12-21 NOTE — Telephone Encounter (Signed)
Received faxed from [facility] req PA or new rx for Metformin ER 500mg  osmotic tabs.   Per charlotte, okey to change it to Glucophage XR. Rx sent.

## 2017-02-16 ENCOUNTER — Other Ambulatory Visit: Payer: Self-pay | Admitting: Nurse Practitioner

## 2017-02-16 DIAGNOSIS — Z1231 Encounter for screening mammogram for malignant neoplasm of breast: Secondary | ICD-10-CM

## 2017-02-22 ENCOUNTER — Ambulatory Visit
Admission: RE | Admit: 2017-02-22 | Discharge: 2017-02-22 | Disposition: A | Discharge status: Home / Self Care | Payer: Federal, State, Local not specified - PPO | Source: Ambulatory Visit | Attending: Nurse Practitioner | Admitting: Nurse Practitioner

## 2017-02-22 DIAGNOSIS — Z1231 Encounter for screening mammogram for malignant neoplasm of breast: Secondary | ICD-10-CM

## 2017-03-07 ENCOUNTER — Ambulatory Visit: Payer: Federal, State, Local not specified - PPO | Admitting: Nurse Practitioner

## 2017-03-10 ENCOUNTER — Encounter: Payer: Self-pay | Admitting: Nurse Practitioner

## 2017-03-10 ENCOUNTER — Other Ambulatory Visit: Payer: Self-pay | Admitting: Nurse Practitioner

## 2017-03-10 ENCOUNTER — Ambulatory Visit (INDEPENDENT_AMBULATORY_CARE_PROVIDER_SITE_OTHER): Payer: Federal, State, Local not specified - PPO | Admitting: Nurse Practitioner

## 2017-03-10 VITALS — BP 142/90 | HR 76 | Temp 98.8°F | Ht 68.0 in | Wt 297.0 lb

## 2017-03-10 DIAGNOSIS — E118 Type 2 diabetes mellitus with unspecified complications: Secondary | ICD-10-CM

## 2017-03-10 DIAGNOSIS — E782 Mixed hyperlipidemia: Secondary | ICD-10-CM | POA: Diagnosis not present

## 2017-03-10 DIAGNOSIS — J40 Bronchitis, not specified as acute or chronic: Secondary | ICD-10-CM

## 2017-03-10 DIAGNOSIS — E039 Hypothyroidism, unspecified: Secondary | ICD-10-CM | POA: Diagnosis not present

## 2017-03-10 DIAGNOSIS — H6012 Cellulitis of left external ear: Secondary | ICD-10-CM

## 2017-03-10 DIAGNOSIS — I10 Essential (primary) hypertension: Secondary | ICD-10-CM

## 2017-03-10 MED ORDER — LEVOTHYROXINE SODIUM 175 MCG PO TABS: 175.0000 ug | ORAL_TABLET | Freq: Every day | ORAL | 0 refills | Status: DC

## 2017-03-10 MED ORDER — GLIMEPIRIDE 4 MG PO TABS: 4.0000 mg | ORAL_TABLET | Freq: Every day | ORAL | 0 refills | Status: DC

## 2017-03-10 MED ORDER — LISINOPRIL 20 MG PO TABS: 20.0000 mg | ORAL_TABLET | Freq: Two times a day (BID) | ORAL | 0 refills | Status: DC

## 2017-03-10 MED ORDER — DM-GUAIFENESIN ER 30-600 MG PO TB12: 1.0000 | ORAL_TABLET | Freq: Two times a day (BID) | ORAL | 0 refills | Status: DC | PRN

## 2017-03-10 MED ORDER — BLOOD GLUCOSE MONITOR KIT: PACK | 1 refills | Status: AC

## 2017-03-10 MED ORDER — ALBUTEROL SULFATE HFA 108 (90 BASE) MCG/ACT IN AERS: 1.0000 | INHALATION_SPRAY | Freq: Four times a day (QID) | RESPIRATORY_TRACT | 2 refills | Status: DC | PRN

## 2017-03-10 MED ORDER — DOXYCYCLINE HYCLATE 100 MG PO TABS: 100.0000 mg | ORAL_TABLET | Freq: Two times a day (BID) | ORAL | 0 refills | Status: AC

## 2017-03-10 MED ORDER — HYDROCHLOROTHIAZIDE 25 MG PO TABS: 25.0000 mg | ORAL_TABLET | Freq: Every day | ORAL | 0 refills | Status: DC

## 2017-03-10 MED ORDER — BENZONATATE 100 MG PO CAPS: 100.0000 mg | ORAL_CAPSULE | Freq: Three times a day (TID) | ORAL | 0 refills | Status: DC | PRN

## 2017-03-10 MED ORDER — FLUTICASONE PROPIONATE 50 MCG/ACT NA SUSP: 2.0000 | Freq: Every day | NASAL | 0 refills | Status: DC

## 2017-03-10 MED ORDER — SYNTHROID 175 MCG PO TABS: 175.0000 ug | ORAL_TABLET | Freq: Every day | ORAL | 0 refills | Status: DC

## 2017-03-10 NOTE — Progress Notes (Signed)
Subjective:  Patient ID: Emily Zavala, female    DOB: [birth year]  Age: 51 y.o. MRN: [id number]  CC: Follow-up (6 mo fu/bump on left ear,warm to touch and painful and coughing brown mucus 1 wk comes and goes)  Otalgia   There is pain in the left ear. This is a new problem. The current episode started in the past 7 days. The problem has been unchanged. There has been no fever. Associated symptoms include coughing and a sore throat. Pertinent negatives include no abdominal pain, diarrhea, ear discharge, headaches, hearing loss, neck pain, rash or rhinorrhea. She has tried nothing for the symptoms.  Cough  This is a new problem. The current episode started in the past 7 days. The problem has been unchanged. The cough is productive of purulent sputum. Associated symptoms include ear pain, nasal congestion, postnasal drip, a sore throat and shortness of breath. Pertinent negatives include no chills, fever, headaches, heartburn, rash, rhinorrhea, weight loss or wheezing. The symptoms are aggravated by lying down. She has tried nothing for the symptoms. Her past medical history is significant for asthma and environmental allergies.   DM: Stopped taking metformin due to GI side effects. Does not check glucose at home. Does not follow low carb diet. Had diabetic eye exam completed by opthalmologist.  Hypothyroidism: Taking medications as prescribed.  HTN: Taking amlodipine, and HCTZ, as prescribed. Does not follow low salt diet.   Outpatient Medications Prior to Visit  Medication Sig Dispense Refill  . amLODipine (NORVASC) 5 MG tablet TAKE 1 TABLET BY MOUTH DAILY 90 tablet 3  . atorvastatin (LIPITOR) 20 MG tablet TAKE 1 TABLET BY MOUTH DAILY 90 tablet 3  . hydrochlorothiazide (HYDRODIURIL) 25 MG tablet Take 1 tablet (25 mg total) by mouth daily. 90 tablet 0  . levothyroxine (SYNTHROID, LEVOTHROID) 175 MCG tablet Take 1 tablet (175 mcg total) by mouth daily before breakfast. 90 tablet 0  .  lisinopril (PRINIVIL,ZESTRIL) 20 MG tablet Take 1 tablet (20 mg total) by mouth 2 (two) times daily. 180 tablet 0  . albuterol (PROVENTIL HFA;VENTOLIN HFA) 108 (90 Base) MCG/ACT inhaler Inhale 2 puffs into the lungs every 6 (six) hours as needed for wheezing or shortness of breath. (Patient not taking: Reported on 03/10/2017) 1 Inhaler 2  . cetirizine (ZYRTEC) 10 MG tablet Take 1 tablet (10 mg total) by mouth daily. (Patient not taking: Reported on 03/10/2017) 30 tablet 0  . doxycycline (VIBRA-TABS) 100 MG tablet Take 1 tablet (100 mg total) by mouth 2 (two) times daily. (Patient not taking: Reported on 03/10/2017) 14 tablet 0  . fluticasone (FLONASE) 50 MCG/ACT nasal spray Place 2 sprays into both nostrils daily. (Patient not taking: Reported on 03/10/2017) 16 g 0  . guaiFENesin (MUCINEX) 600 MG 12 hr tablet Take 1 tablet (600 mg total) by mouth 2 (two) times daily as needed for cough or to loosen phlegm. (Patient not taking: Reported on 03/10/2017) 14 tablet 0  . metFORMIN (GLUCOPHAGE XR) 500 MG 24 hr tablet Take 1 tablet (500 mg total) by mouth 2 (two) times daily with a meal. (Patient not taking: Reported on 03/10/2017) 180 tablet 0  . predniSONE (DELTASONE) 20 MG tablet Take 2 tablets (40 mg total) by mouth daily with breakfast. (Patient not taking: Reported on 03/10/2017) 10 tablet 0   No facility-administered medications prior to visit.     ROS Review of Systems  Constitutional: Negative for chills, fever, malaise/fatigue and weight loss.  HENT: Positive for congestion, ear pain, postnasal drip  and sore throat. Negative for ear discharge, hearing loss and rhinorrhea.   Eyes: Negative for blurred vision.  Respiratory: Positive for cough and shortness of breath. Negative for wheezing.   Gastrointestinal: Negative.  Negative for abdominal pain, blood in stool, constipation, diarrhea, heartburn and melena.  Genitourinary: Negative.   Musculoskeletal: Negative.  Negative for neck pain.  Skin: Negative.   Negative for rash.  Neurological: Negative for headaches.  Endo/Heme/Allergies: Positive for environmental allergies.  Psychiatric/Behavioral: Negative for depression and suicidal ideas. The patient is not nervous/anxious and does not have insomnia.      Objective:  BP (!) 142/90   Pulse 76   Temp 98.8 F (37.1 C)   Ht '5\' 8"'$  (1.727 m)   Wt 297 lb (134.7 kg)   SpO2 99%   BMI 45.16 kg/m   BP Readings from Last 3 Encounters:  03/10/17 (!) 142/90  12/03/16 150/86  12/03/16 130/90    Wt Readings from Last 3 Encounters:  03/10/17 297 lb (134.7 kg)  12/03/16 295 lb (133.8 kg)  06/23/16 279 lb (126.6 kg)    Physical Exam  Constitutional: She is oriented to person, place, and time. No distress.  HENT:  Right Ear: External ear normal.  Left Ear: External ear normal.  Nose: Nose normal.  Mouth/Throat: No oropharyngeal exudate.  Eyes: No scleral icterus.  Neck: Normal range of motion. Neck supple.  Cardiovascular: Normal rate, regular rhythm and normal heart sounds.   Pulmonary/Chest: Effort normal and breath sounds normal. No respiratory distress.  Abdominal: Soft. She exhibits no distension.  Musculoskeletal: Normal range of motion. She exhibits no edema.  Lymphadenopathy:    She has no cervical adenopathy.  Neurological: She is alert and oriented to person, place, and time.  Normal diabetic foot exam.  Skin: Skin is warm and dry.  Psychiatric: She has a normal mood and affect. Her behavior is normal.    Lab Results  Component Value Date   WBC 5.0 12/03/2016   HGB 13.6 12/03/2016   HCT 42.7 12/03/2016   PLT 272 12/03/2016   GLUCOSE 114 (H) 12/19/2016   CHOL 231 (H) 06/23/2016   TRIG 127.0 06/23/2016   HDL 56.90 06/23/2016   LDLCALC 149 (H) 06/23/2016   ALT 24 06/23/2016   AST 15 06/23/2016   NA 139 12/19/2016   K 3.8 12/19/2016   CL 102 12/19/2016   CREATININE 0.84 12/19/2016   BUN 11 12/19/2016   CO2 32 12/19/2016   TSH 2.42 12/19/2016   HGBA1C 7.1 (H)  12/19/2016    Mm Screening Breast Tomo Bilateral  Result Date: 02/23/2017 CLINICAL DATA:  Screening. EXAM: 2D DIGITAL SCREENING BILATERAL MAMMOGRAM WITH CAD AND ADJUNCT TOMO COMPARISON:  No prior films ACR Breast Density Category b: There are scattered areas of fibroglandular density. FINDINGS: There are no findings suspicious for malignancy. Images were processed with CAD. IMPRESSION: No mammographic evidence of malignancy. A result letter of this screening mammogram will be mailed directly to the patient. RECOMMENDATION: Screening mammogram in one year. (Code:SM-B-01Y) BI-RADS CATEGORY  1: Negative. Electronically Signed   By: Abelardo Diesel M.D.   On: 02/23/2017 10:26    Assessment & Plan:   Stefanny was seen today for follow-up.  Diagnoses and all orders for this visit:  Essential hypertension, benign -     hydrochlorothiazide (HYDRODIURIL) 25 MG tablet; Take 1 tablet (25 mg total) by mouth daily. -     lisinopril (PRINIVIL,ZESTRIL) 20 MG tablet; Take 1 tablet (20 mg total) by mouth 2 (  two) times daily.  Hypothyroidism, unspecified type -     Discontinue: levothyroxine (SYNTHROID, LEVOTHROID) 175 MCG tablet; Take 1 tablet (175 mcg total) by mouth daily before breakfast. -     SYNTHROID 175 MCG tablet; Take 1 tablet (175 mcg total) by mouth daily before breakfast.  Type 2 diabetes mellitus with complication, without long-term current use of insulin (HCC) -     glimepiride (AMARYL) 4 MG tablet; Take 1 tablet (4 mg total) by mouth daily before breakfast. -     blood glucose meter kit and supplies KIT; Dispense based on patient and insurance preference. Use once a day (before breakfast) as directed.  Mixed hyperlipidemia  Bronchitis -     benzonatate (TESSALON) 100 MG capsule; Take 1 capsule (100 mg total) by mouth 3 (three) times daily as needed for cough. -     dextromethorphan-guaiFENesin (MUCINEX DM) 30-600 MG 12hr tablet; Take 1 tablet by mouth 2 (two) times daily as needed for  cough. -     fluticasone (FLONASE) 50 MCG/ACT nasal spray; Place 2 sprays into both nostrils daily. -     albuterol (PROVENTIL HFA;VENTOLIN HFA) 108 (90 Base) MCG/ACT inhaler; Inhale 1 puff into the lungs every 6 (six) hours as needed for wheezing or shortness of breath.  Cellulitis of auricle of left ear -     doxycycline (VIBRA-TABS) 100 MG tablet; Take 1 tablet (100 mg total) by mouth 2 (two) times daily.   I have discontinued Ms. Denny's guaiFENesin, fluticasone, cetirizine, predniSONE, doxycycline, levothyroxine, metFORMIN, and levothyroxine. I have also changed her albuterol. Additionally, I am having her start on glimepiride, benzonatate, dextromethorphan-guaiFENesin, fluticasone, doxycycline, blood glucose meter kit and supplies, and SYNTHROID. Lastly, I am having her maintain her atorvastatin, amLODipine, hydrochlorothiazide, and lisinopril.  Meds ordered this encounter  Medications  . glimepiride (AMARYL) 4 MG tablet    Sig: Take 1 tablet (4 mg total) by mouth daily before breakfast.    Dispense:  90 tablet    Refill:  0    Order Specific Question:   Supervising Provider    Answer:   Cassandria Anger [[id number]]  . benzonatate (TESSALON) 100 MG capsule    Sig: Take 1 capsule (100 mg total) by mouth 3 (three) times daily as needed for cough.    Dispense:  20 capsule    Refill:  0    Order Specific Question:   Supervising Provider    Answer:   Cassandria Anger [[id number]]  . dextromethorphan-guaiFENesin (MUCINEX DM) 30-600 MG 12hr tablet    Sig: Take 1 tablet by mouth 2 (two) times daily as needed for cough.    Dispense:  14 tablet    Refill:  0    Order Specific Question:   Supervising Provider    Answer:   Cassandria Anger [[id number]]  . fluticasone (FLONASE) 50 MCG/ACT nasal spray    Sig: Place 2 sprays into both nostrils daily.    Dispense:  16 g    Refill:  0    Order Specific Question:   Supervising Provider    Answer:   Cassandria Anger [[id number]]  . doxycycline  (VIBRA-TABS) 100 MG tablet    Sig: Take 1 tablet (100 mg total) by mouth 2 (two) times daily.    Dispense:  14 tablet    Refill:  0    Order Specific Question:   Supervising Provider    Answer:   Cassandria Anger [[id number]]  . hydrochlorothiazide (HYDRODIURIL) 25 MG tablet  Sig: Take 1 tablet (25 mg total) by mouth daily.    Dispense:  90 tablet    Refill:  0    **Patient requests 90 days supply**    Order Specific Question:   Supervising Provider    Answer:   Cassandria Anger [[id number]]  . DISCONTD: levothyroxine (SYNTHROID, LEVOTHROID) 175 MCG tablet    Sig: Take 1 tablet (175 mcg total) by mouth daily before breakfast.    Dispense:  90 tablet    Refill:  0    Order Specific Question:   Supervising Provider    Answer:   Cassandria Anger [[id number]]  . lisinopril (PRINIVIL,ZESTRIL) 20 MG tablet    Sig: Take 1 tablet (20 mg total) by mouth 2 (two) times daily.    Dispense:  180 tablet    Refill:  0    Order Specific Question:   Supervising Provider    Answer:   Cassandria Anger [[id number]]  . albuterol (PROVENTIL HFA;VENTOLIN HFA) 108 (90 Base) MCG/ACT inhaler    Sig: Inhale 1 puff into the lungs every 6 (six) hours as needed for wheezing or shortness of breath.    Dispense:  1 Inhaler    Refill:  2    Order Specific Question:   Supervising Provider    Answer:   Cassandria Anger [[id number]]  . blood glucose meter kit and supplies KIT    Sig: Dispense based on patient and insurance preference. Use once a day (before breakfast) as directed.    Dispense:  1 each    Refill:  1    Order Specific Question:   Supervising Provider    Answer:   Cassandria Anger [[id number]]    Order Specific Question:   Number of strips    Answer:   100    Order Specific Question:   Number of lancets    Answer:   100  . SYNTHROID 175 MCG tablet    Sig: Take 1 tablet (175 mcg total) by mouth daily before breakfast.    Dispense:  90 tablet    Refill:  0    Order Specific Question:   Supervising  Provider    Answer:   Cassandria Anger [[id number]]    Follow-up: Return in about 3 months (around 06/10/2017) for DM and HTN, hyperlipidemia (fasting for labs).  Wilfred Lacy, NP

## 2017-03-10 NOTE — Patient Instructions (Addendum)
Bring opthalmology report to next OV.  Carbohydrate Counting for Diabetes Mellitus, Adult Carbohydrate counting is a method for keeping track of how many carbohydrates you eat. Eating carbohydrates naturally increases the amount of sugar (glucose) in the blood. Counting how many carbohydrates you eat helps keep your blood glucose within normal limits, which helps you manage your diabetes (diabetes mellitus). It is important to know how many carbohydrates you can safely have in each meal. This is different for every person. A diet and nutrition specialist (registered dietitian) can help you make a meal plan and calculate how many carbohydrates you should have at each meal and snack. Carbohydrates are found in the following foods:  Grains, such as breads and cereals.  Dried beans and soy products.  Starchy vegetables, such as potatoes, peas, and corn.  Fruit and fruit juices.  Milk and yogurt.  Sweets and snack foods, such as cake, cookies, candy, chips, and soft drinks.  How do I count carbohydrates? There are two ways to count carbohydrates in food. You can use either of the methods or a combination of both. Reading "Nutrition Facts" on packaged food The "Nutrition Facts" list is included on the labels of almost all packaged foods and beverages in the [place] It includes:  The serving size.  Information about nutrients in each serving, including the grams (g) of carbohydrate per serving.  To use the "Nutrition Facts":  Decide how many servings you will have.  Multiply the number of servings by the number of carbohydrates per serving.  The resulting number is the total amount of carbohydrates that you will be having.  Learning standard serving sizes of other foods When you eat foods containing carbohydrates that are not packaged or do not include "Nutrition Facts" on the label, you need to measure the servings in order to count the amount of carbohydrates:  Measure the foods that  you will eat with a food scale or measuring cup, if needed.  Decide how many standard-size servings you will eat.  Multiply the number of servings by 15. Most carbohydrate-rich foods have about 15 g of carbohydrates per serving. ? For example, if you eat 8 oz (170 g) of strawberries, you will have eaten 2 servings and 30 g of carbohydrates (2 servings x 15 g = 30 g).  For foods that have more than one food mixed, such as soups and casseroles, you must count the carbohydrates in each food that is included.  The following list contains standard serving sizes of common carbohydrate-rich foods. Each of these servings has about 15 g of carbohydrates:   hamburger bun or  English muffin.   oz (15 mL) syrup.   oz (14 g) jelly.  1 slice of bread.  1 six-inch tortilla.  3 oz (85 g) cooked rice or pasta.  4 oz (113 g) cooked dried beans.  4 oz (113 g) starchy vegetable, such as peas, corn, or potatoes.  4 oz (113 g) hot cereal.  4 oz (113 g) mashed potatoes or  of a large baked potato.  4 oz (113 g) canned or frozen fruit.  4 oz (120 mL) fruit juice.  4-6 crackers.  6 chicken nuggets.  6 oz (170 g) unsweetened dry cereal.  6 oz (170 g) plain fat-free yogurt or yogurt sweetened with artificial sweeteners.  8 oz (240 mL) milk.  8 oz (170 g) fresh fruit or one small piece of fruit.  24 oz (680 g) popped popcorn.  Example of carbohydrate counting Sample meal  3 oz (85 g) chicken breast.  6 oz (170 g) brown rice.  4 oz (113 g) corn.  8 oz (240 mL) milk.  8 oz (170 g) strawberries with sugar-free whipped topping. Carbohydrate calculation 1. Identify the foods that contain carbohydrates: ? Rice. ? Corn. ? Milk. ? Strawberries. 2. Calculate how many servings you have of each food: ? 2 servings rice. ? 1 serving corn. ? 1 serving milk. ? 1 serving strawberries. 3. Multiply each number of servings by 15 g: ? 2 servings rice x 15 g = 30 g. ? 1 serving corn x  15 g = 15 g. ? 1 serving milk x 15 g = 15 g. ? 1 serving strawberries x 15 g = 15 g. 4. Add together all of the amounts to find the total grams of carbohydrates eaten: ? 30 g + 15 g + 15 g + 15 g = 75 g of carbohydrates total. This information is not intended to replace advice given to you by your health care provider. Make sure you discuss any questions you have with your health care provider. Document Released: 09/19/2005 Document Revised: 04/08/2016 Document Reviewed: 03/02/2016 Elsevier Interactive Patient Education  2018 [employer].   Exercising to [employer] Exercising can help you to lose weight. In order to lose weight through exercise, you need to do vigorous-intensity exercise. You can tell that you are exercising with vigorous intensity if you are breathing very hard and fast and cannot hold a conversation while exercising. Moderate-intensity exercise helps to maintain your current weight. You can tell that you are exercising at a moderate level if you have a higher heart rate and faster breathing, but you are still able to hold a conversation. How often should I exercise? Choose an activity that you enjoy and set realistic goals. Your health care provider can help you to make an activity plan that works for you. Exercise regularly as directed by your health care provider. This may include:  Doing resistance training twice each week, such as: ? Push-ups. ? Sit-ups. ? Lifting weights. ? Using resistance bands.  Doing a given intensity of exercise for a given amount of time. Choose from these options: ? 150 minutes of moderate-intensity exercise every week. ? 75 minutes of vigorous-intensity exercise every week. ? A mix of moderate-intensity and vigorous-intensity exercise every week.  Children, pregnant women, people who are out of shape, people who are overweight, and older adults may need to consult a health care provider for individual recommendations. If you have any sort  of medical condition, be sure to consult your health care provider before starting a new exercise program. What are some activities that can help me to lose weight?  Walking at a rate of at least 4.5 miles an hour.  Jogging or running at a rate of 5 miles per hour.  Biking at a rate of at least 10 miles per hour.  Lap swimming.  Roller-skating or in-line skating.  Cross-country skiing.  Vigorous competitive sports, such as football, basketball, and soccer.  Jumping rope.  Aerobic dancing. How can I be more active in my day-to-day activities?  Use the stairs instead of the elevator.  Take a walk during your lunch break.  If you drive, park your car farther away from work or school.  If you take public transportation, get off one stop early and walk the rest of the way.  Make all of your phone calls while standing up and walking around.  Get up,  stretch, and walk around every 30 minutes throughout the day. What guidelines should I follow while exercising?  Do not exercise so much that you hurt yourself, feel dizzy, or get very short of breath.  Consult your health care provider prior to starting a new exercise program.  Wear comfortable clothes and shoes with good support.  Drink plenty of water while you exercise to prevent dehydration or heat stroke. Body water is lost during exercise and must be replaced.  Work out until you breathe faster and your heart beats faster. This information is not intended to replace advice given to you by your health care provider. Make sure you discuss any questions you have with your health care provider. Document Released: 10/22/2010 Document Revised: 02/25/2016 Document Reviewed: 02/20/2014 Elsevier Interactive Patient Education  [employer].

## 2017-03-16 ENCOUNTER — Encounter: Payer: Self-pay | Admitting: Nurse Practitioner

## 2017-03-21 NOTE — Telephone Encounter (Signed)
Pt called in and said that she would like nurse to call her back when you get a chance

## 2017-05-09 ENCOUNTER — Other Ambulatory Visit: Payer: Self-pay | Admitting: Nurse Practitioner

## 2017-05-09 DIAGNOSIS — I10 Essential (primary) hypertension: Secondary | ICD-10-CM

## 2017-06-09 ENCOUNTER — Ambulatory Visit (INDEPENDENT_AMBULATORY_CARE_PROVIDER_SITE_OTHER): Payer: Federal, State, Local not specified - PPO | Admitting: Nurse Practitioner

## 2017-06-09 ENCOUNTER — Other Ambulatory Visit (INDEPENDENT_AMBULATORY_CARE_PROVIDER_SITE_OTHER): Payer: Federal, State, Local not specified - PPO

## 2017-06-09 ENCOUNTER — Encounter: Payer: Self-pay | Admitting: Nurse Practitioner

## 2017-06-09 VITALS — BP 136/86 | HR 78 | Temp 98.3°F | Ht 68.0 in | Wt 292.0 lb

## 2017-06-09 DIAGNOSIS — I10 Essential (primary) hypertension: Secondary | ICD-10-CM

## 2017-06-09 DIAGNOSIS — E118 Type 2 diabetes mellitus with unspecified complications: Secondary | ICD-10-CM

## 2017-06-09 DIAGNOSIS — E039 Hypothyroidism, unspecified: Secondary | ICD-10-CM | POA: Diagnosis not present

## 2017-06-09 DIAGNOSIS — T502X5A Adverse effect of carbonic-anhydrase inhibitors, benzothiadiazides and other diuretics, initial encounter: Secondary | ICD-10-CM | POA: Diagnosis not present

## 2017-06-09 DIAGNOSIS — E782 Mixed hyperlipidemia: Secondary | ICD-10-CM

## 2017-06-09 DIAGNOSIS — Z1211 Encounter for screening for malignant neoplasm of colon: Secondary | ICD-10-CM

## 2017-06-09 DIAGNOSIS — E876 Hypokalemia: Secondary | ICD-10-CM

## 2017-06-09 LAB — HEMOGLOBIN A1C: Hgb A1c MFr Bld: 6.4 % (ref 4.6–6.5)

## 2017-06-09 LAB — HEPATIC FUNCTION PANEL
ALK PHOS: 75 U/L (ref 39–117)
ALT: 23 U/L (ref 0–35)
AST: 18 U/L (ref 0–37)
Albumin: 4.3 g/dL (ref 3.5–5.2)
BILIRUBIN TOTAL: 0.5 mg/dL (ref 0.2–1.2)
Bilirubin, Direct: 0.2 mg/dL (ref 0.0–0.3)
Total Protein: 7.5 g/dL (ref 6.0–8.3)

## 2017-06-09 LAB — BASIC METABOLIC PANEL
BUN: 12 mg/dL (ref 6–23)
CALCIUM: 9.8 mg/dL (ref 8.4–10.5)
CO2: 34 mEq/L — ABNORMAL HIGH (ref 19–32)
Chloride: 100 mEq/L (ref 96–112)
Creatinine, Ser: 0.78 mg/dL (ref 0.40–1.20)
GFR: 100.37 mL/min (ref >60.00)
GLUCOSE: 92 mg/dL (ref 70–99)
Potassium: 3.2 mEq/L — ABNORMAL LOW (ref 3.5–5.1)
SODIUM: 141 meq/L (ref 135–145)

## 2017-06-09 LAB — LIPID PANEL
CHOLESTEROL: 157 mg/dL (ref 0–200)
HDL: 52.4 mg/dL (ref >39.00)
LDL Cholesterol: 84 mg/dL (ref 0–99)
NONHDL: 104.14
Total CHOL/HDL Ratio: 3
Triglycerides: 103 mg/dL (ref 0.0–149.0)
VLDL: 20.6 mg/dL (ref 0.0–40.0)

## 2017-06-09 MED ORDER — ATORVASTATIN CALCIUM 20 MG PO TABS: 20.0000 mg | ORAL_TABLET | Freq: Every day | ORAL | 3 refills | Status: DC

## 2017-06-09 MED ORDER — SYNTHROID 175 MCG PO TABS: 175.0000 ug | ORAL_TABLET | Freq: Every day | ORAL | 3 refills | Status: DC

## 2017-06-09 MED ORDER — POTASSIUM CHLORIDE CRYS ER 20 MEQ PO TBCR: 20.0000 meq | EXTENDED_RELEASE_TABLET | Freq: Every day | ORAL | 3 refills | Status: DC

## 2017-06-09 MED ORDER — AMLODIPINE BESYLATE 5 MG PO TABS: 5.0000 mg | ORAL_TABLET | Freq: Every day | ORAL | 3 refills | Status: DC

## 2017-06-09 MED ORDER — UNISTIK 3 MISC: 1.0000 [IU] | Freq: Two times a day (BID) | 11 refills | Status: AC

## 2017-06-09 MED ORDER — LISINOPRIL 20 MG PO TABS: 20.0000 mg | ORAL_TABLET | Freq: Two times a day (BID) | ORAL | 3 refills | Status: DC

## 2017-06-09 MED ORDER — GLIMEPIRIDE 4 MG PO TABS: 4.0000 mg | ORAL_TABLET | Freq: Every day | ORAL | 0 refills | Status: DC

## 2017-06-09 MED ORDER — HYDROCHLOROTHIAZIDE 25 MG PO TABS: 25.0000 mg | ORAL_TABLET | Freq: Every day | ORAL | 3 refills | Status: DC

## 2017-06-09 NOTE — Patient Instructions (Signed)
Go to basement for blood draw.  You will be contacted to schedule appt for colonoscopy.  Please sign medical release to get record from opthalmology.  DASH Eating Plan DASH stands for "Dietary Approaches to Stop Hypertension." The DASH eating plan is a healthy eating plan that has been shown to reduce high blood pressure (hypertension). It may also reduce your risk for type 2 diabetes, heart disease, and stroke. The DASH eating plan may also help with weight loss. What are tips for following this plan? General guidelines  Avoid eating more than 2,300 mg (milligrams) of salt (sodium) a day. If you have hypertension, you may need to reduce your sodium intake to 1,500 mg a day.  Limit alcohol intake to no more than 1 drink a day for nonpregnant women and 2 drinks a day for men. One drink equals 12 oz of beer, 5 oz of wine, or 1 oz of hard liquor.  Work with your health care provider to maintain a healthy body weight or to lose weight. Ask what an ideal weight is for you.  Get at least 30 minutes of exercise that causes your heart to beat faster (aerobic exercise) most days of the week. Activities may include walking, swimming, or biking.  Work with your health care provider or diet and nutrition specialist (dietitian) to adjust your eating plan to your individual calorie needs. Reading food labels  Check food labels for the amount of sodium per serving. Choose foods with less than 5 percent of the Daily Value of sodium. Generally, foods with less than 300 mg of sodium per serving fit into this eating plan.  To find whole grains, look for the word "whole" as the first word in the ingredient list. Shopping  Buy products labeled as "low-sodium" or "no salt added."  Buy fresh foods. Avoid canned foods and premade or frozen meals. Cooking  Avoid adding salt when cooking. Use salt-free seasonings or herbs instead of table salt or sea salt. Check with your health care provider or pharmacist  before using salt substitutes.  Do not fry foods. Cook foods using healthy methods such as baking, boiling, grilling, and broiling instead.  Cook with heart-healthy oils, such as olive, canola, soybean, or sunflower oil. Meal planning   Eat a balanced diet that includes: ? 5 or more servings of fruits and vegetables each day. At each meal, try to fill half of your plate with fruits and vegetables. ? Up to 6-8 servings of whole grains each day. ? Less than 6 oz of lean meat, poultry, or fish each day. A 3-oz serving of meat is about the same size as a deck of cards. One egg equals 1 oz. ? 2 servings of low-fat dairy each day. ? A serving of nuts, seeds, or beans 5 times each week. ? Heart-healthy fats. Healthy fats called Omega-3 fatty acids are found in foods such as flaxseeds and coldwater fish, like sardines, salmon, and mackerel.  Limit how much you eat of the following: ? Canned or prepackaged foods. ? Food that is high in trans fat, such as fried foods. ? Food that is high in saturated fat, such as fatty meat. ? Sweets, desserts, sugary drinks, and other foods with added sugar. ? Full-fat dairy products.  Do not salt foods before eating.  Try to eat at least 2 vegetarian meals each week.  Eat more home-cooked food and less restaurant, buffet, and fast food.  When eating at a restaurant, ask that your food be prepared  with less salt or no salt, if possible. What foods are recommended? The items listed may not be a complete list. Talk with your dietitian about what dietary choices are best for you. Grains Whole-grain or whole-wheat bread. Whole-grain or whole-wheat pasta. Brown rice. [place]. Bulgur. Whole-grain and low-sodium cereals. Pita bread. Low-fat, low-sodium crackers. Whole-wheat flour tortillas. Vegetables Fresh or frozen vegetables (raw, steamed, roasted, or grilled). Low-sodium or reduced-sodium tomato and vegetable juice. Low-sodium or reduced-sodium tomato  sauce and tomato paste. Low-sodium or reduced-sodium canned vegetables. Fruits All fresh, dried, or frozen fruit. Canned fruit in natural juice (without added sugar). Meat and other protein foods Skinless chicken or [place]. Ground chicken or [place]. Pork with fat trimmed off. Fish and seafood. Egg whites. Dried beans, peas, or lentils. Unsalted nuts, nut butters, and seeds. Unsalted canned beans. Lean cuts of beef with fat trimmed off. Low-sodium, lean deli meat. Dairy Low-fat (1%) or fat-free (skim) milk. Fat-free, low-fat, or reduced-fat cheeses. Nonfat, low-sodium ricotta or cottage cheese. Low-fat or nonfat yogurt. Low-fat, low-sodium cheese. Fats and oils Soft margarine without trans fats. Vegetable oil. Low-fat, reduced-fat, or light mayonnaise and salad dressings (reduced-sodium). Canola, safflower, olive, soybean, and sunflower oils. Avocado. Seasoning and other foods Herbs. Spices. Seasoning mixes without salt. Unsalted popcorn and pretzels. Fat-free sweets. What foods are not recommended? The items listed may not be a complete list. Talk with your dietitian about what dietary choices are best for you. Grains Baked goods made with fat, such as croissants, muffins, or some breads. Dry pasta or rice meal packs. Vegetables Creamed or fried vegetables. Vegetables in a cheese sauce. Regular canned vegetables (not low-sodium or reduced-sodium). Regular canned tomato sauce and paste (not low-sodium or reduced-sodium). Regular tomato and vegetable juice (not low-sodium or reduced-sodium). Angie Fava. Olives. Fruits Canned fruit in a light or heavy syrup. Fried fruit. Fruit in cream or butter sauce. Meat and other protein foods Fatty cuts of meat. Ribs. Fried meat. Berniece Salines. Sausage. Bologna and other processed lunch meats. Salami. Fatback. Hotdogs. Bratwurst. Salted nuts and seeds. Canned beans with added salt. Canned or smoked fish. Whole eggs or egg yolks. Chicken or [place] with skin. Dairy Whole  or 2% milk, cream, and half-and-half. Whole or full-fat cream cheese. Whole-fat or sweetened yogurt. Full-fat cheese. Nondairy creamers. Whipped toppings. Processed cheese and cheese spreads. Fats and oils Butter. Stick margarine. Lard. Shortening. Ghee. Bacon fat. Tropical oils, such as coconut, palm kernel, or palm oil. Seasoning and other foods Salted popcorn and pretzels. Onion salt, garlic salt, seasoned salt, table salt, and sea salt. Worcestershire sauce. Tartar sauce. Barbecue sauce. Teriyaki sauce. Soy sauce, including reduced-sodium. Steak sauce. Canned and packaged gravies. Fish sauce. Oyster sauce. Cocktail sauce. Horseradish that you find on the shelf. Ketchup. Mustard. Meat flavorings and tenderizers. Bouillon cubes. Hot sauce and Tabasco sauce. Premade or packaged marinades. Premade or packaged taco seasonings. Relishes. Regular salad dressings. Where to find more information:  National Heart, Lung, and [place]: https://wilson-eaton.com/  American Heart Association: www.heart.org Summary  The DASH eating plan is a healthy eating plan that has been shown to reduce high blood pressure (hypertension). It may also reduce your risk for type 2 diabetes, heart disease, and stroke.  With the DASH eating plan, you should limit salt (sodium) intake to 2,300 mg a day. If you have hypertension, you may need to reduce your sodium intake to 1,500 mg a day.  When on the DASH eating plan, aim to eat more fresh fruits and vegetables, whole grains, lean  proteins, low-fat dairy, and heart-healthy fats.  Work with your health care provider or diet and nutrition specialist (dietitian) to adjust your eating plan to your individual calorie needs. This information is not intended to replace advice given to you by your health care provider. Make sure you discuss any questions you have with your health care provider. Document Released: 09/08/2011 Document Revised: 09/12/2016 Document Reviewed:  09/12/2016 Elsevier Interactive Patient Education  2017 [employer].

## 2017-06-09 NOTE — Progress Notes (Signed)
Subjective:  Patient ID: Emily Zavala, female    DOB: 1965-12-12  Age: 51 y.o. MRN: [id number]  CC: Follow-up (3 mo fu/ working on her weight loss/)   HPI   DM: Has decreased portion size and sugar/carb intake. Has not been able to check glucose at home due to non functioning lancet device.  HTN: Stable. had eye exam done 12/2016 Has started walking daily.  Hyperlipidemia: No adverse effects with lipitor  Hypothyroidism: Stable with current dose of synthroid.  Denies any acute complains.  Outpatient Medications Prior to Visit  Medication Sig Dispense Refill  . albuterol (PROVENTIL HFA;VENTOLIN HFA) 108 (90 Base) MCG/ACT inhaler Inhale 1 puff into the lungs every 6 (six) hours as needed for wheezing or shortness of breath. 1 Inhaler 2  . blood glucose meter kit and supplies KIT Dispense based on patient and insurance preference. Use once a day (before breakfast) as directed. 1 each 1  . amLODipine (NORVASC) 5 MG tablet TAKE 1 TABLET BY MOUTH DAILY 90 tablet 3  . atorvastatin (LIPITOR) 20 MG tablet TAKE 1 TABLET BY MOUTH DAILY 90 tablet 3  . glimepiride (AMARYL) 4 MG tablet Take 1 tablet (4 mg total) by mouth daily before breakfast. 90 tablet 0  . hydrochlorothiazide (HYDRODIURIL) 25 MG tablet TAKE 1 TABLET BY MOUTH DAILY 90 tablet 0  . lisinopril (PRINIVIL,ZESTRIL) 20 MG tablet Take 1 tablet (20 mg total) by mouth 2 (two) times daily. 180 tablet 0  . SYNTHROID 175 MCG tablet Take 1 tablet (175 mcg total) by mouth daily before breakfast. 90 tablet 0  . benzonatate (TESSALON) 100 MG capsule Take 1 capsule (100 mg total) by mouth 3 (three) times daily as needed for cough. (Patient not taking: Reported on 06/09/2017) 20 capsule 0  . dextromethorphan-guaiFENesin (MUCINEX DM) 30-600 MG 12hr tablet Take 1 tablet by mouth 2 (two) times daily as needed for cough. (Patient not taking: Reported on 06/09/2017) 14 tablet 0  . fluticasone (FLONASE) 50 MCG/ACT nasal spray Place 2 sprays into  both nostrils daily. (Patient not taking: Reported on 06/09/2017) 16 g 0   No facility-administered medications prior to visit.     ROS See HPI  Objective:  BP 136/86   Pulse 78   Temp 98.3 F (36.8 C)   Ht 5' 8"  (1.727 m)   Wt 292 lb (132.5 kg)   SpO2 98%   BMI 44.40 kg/m   BP Readings from Last 3 Encounters:  06/09/17 136/86  03/10/17 (!) 142/90  12/03/16 150/86    Wt Readings from Last 3 Encounters:  06/09/17 292 lb (132.5 kg)  03/10/17 297 lb (134.7 kg)  12/03/16 295 lb (133.8 kg)    Physical Exam  Constitutional: She is oriented to person, place, and time.  Neck: No thyromegaly present.  Cardiovascular: Normal rate and regular rhythm.   Pulmonary/Chest: Effort normal and breath sounds normal.  Musculoskeletal: She exhibits edema.  Neurological: She is alert and oriented to person, place, and time.  Skin: Skin is warm and dry.  Vitals reviewed.   Lab Results  Component Value Date   WBC 5.0 12/03/2016   HGB 13.6 12/03/2016   HCT 42.7 12/03/2016   PLT 272 12/03/2016   GLUCOSE 92 06/09/2017   CHOL 157 06/09/2017   TRIG 103.0 06/09/2017   HDL 52.40 06/09/2017   LDLCALC 84 06/09/2017   ALT 23 06/09/2017   AST 18 06/09/2017   NA 141 06/09/2017   K 3.2 (L) 06/09/2017   CL 100  06/09/2017   CREATININE 0.78 06/09/2017   BUN 12 06/09/2017   CO2 34 (H) 06/09/2017   TSH 2.42 12/19/2016   HGBA1C 6.4 06/09/2017    Mm Screening Breast Tomo Bilateral  Result Date: 02/23/2017 CLINICAL DATA:  Screening. EXAM: 2D DIGITAL SCREENING BILATERAL MAMMOGRAM WITH CAD AND ADJUNCT TOMO COMPARISON:  No prior films ACR Breast Density Category b: There are scattered areas of fibroglandular density. FINDINGS: There are no findings suspicious for malignancy. Images were processed with CAD. IMPRESSION: No mammographic evidence of malignancy. A result letter of this screening mammogram will be mailed directly to the patient. RECOMMENDATION: Screening mammogram in one year.  (Code:SM-B-01Y) BI-RADS CATEGORY  1: Negative. Electronically Signed   By: Abelardo Diesel M.D.   On: 02/23/2017 10:26    Assessment & Plan:   Emily Zavala was seen today for follow-up.  Diagnoses and all orders for this visit:  Essential hypertension, benign -     Basic metabolic panel; Future -     amLODipine (NORVASC) 5 MG tablet; Take 1 tablet (5 mg total) by mouth daily. -     hydrochlorothiazide (HYDRODIURIL) 25 MG tablet; Take 1 tablet (25 mg total) by mouth daily. -     lisinopril (PRINIVIL,ZESTRIL) 20 MG tablet; Take 1 tablet (20 mg total) by mouth 2 (two) times daily.  Type 2 diabetes mellitus with complication, without long-term current use of insulin (HCC) -     Hemoglobin A1c; Future -     Lancets Misc. (UNISTIK 3) MISC; 1 Units by Does not apply route 2 (two) times daily before a meal. Low flow, I.5m, 28G -     glimepiride (AMARYL) 4 MG tablet; Take 1 tablet (4 mg total) by mouth daily before breakfast.  Hypothyroidism, unspecified type -     SYNTHROID 175 MCG tablet; Take 1 tablet (175 mcg total) by mouth daily before breakfast.  Mixed hyperlipidemia -     Lipid panel; Future -     Hepatic function panel; Future -     atorvastatin (LIPITOR) 20 MG tablet; Take 1 tablet (20 mg total) by mouth daily.  Colon cancer screening -     Ambulatory referral to Gastroenterology  Diuretic-induced hypokalemia -     potassium chloride SA (K-DUR,KLOR-CON) 20 MEQ tablet; Take 1 tablet (20 mEq total) by mouth daily.   I have discontinued Emily Zavala's benzonatate, dextromethorphan-guaiFENesin, fluticasone, and ACCU-CHEK SOFTCLIX LANCETS. I have also changed her amLODipine, atorvastatin, and hydrochlorothiazide. Additionally, I am having her start on UNISTIK 3 and potassium chloride SA. Lastly, I am having her maintain her albuterol, blood glucose meter kit and supplies, ACCU-CHEK AVIVA PLUS, SYNTHROID, lisinopril, and glimepiride.  Meds ordered this encounter  Medications  . ACCU-CHEK  AVIVA PLUS test strip    Sig: USE TO TEST BLOOD SUGAR ONCE D BEFORE BREAKFAST    Refill:  1  . DISCONTD: ACCU-CHEK SOFTCLIX LANCETS lancets    Refill:  1  . Lancets Misc. (UNISTIK 3) MISC    Sig: 1 Units by Does not apply route 2 (two) times daily before a meal. Low flow, I.810m 28G    Dispense:  100 each    Refill:  11    Order Specific Question:   Supervising Provider    Answer:   PLCassandria [id number]]  . amLODipine (NORVASC) 5 MG tablet    Sig: Take 1 tablet (5 mg total) by mouth daily.    Dispense:  90 tablet    Refill:  3    **Patient  requests 90 days supply**    Order Specific Question:   Supervising Provider    Answer:   Cassandria Anger [[id number]]  . atorvastatin (LIPITOR) 20 MG tablet    Sig: Take 1 tablet (20 mg total) by mouth daily.    Dispense:  90 tablet    Refill:  3    **Patient requests 90 days supply**    Order Specific Question:   Supervising Provider    Answer:   Cassandria Anger [[id number]]  . hydrochlorothiazide (HYDRODIURIL) 25 MG tablet    Sig: Take 1 tablet (25 mg total) by mouth daily.    Dispense:  90 tablet    Refill:  3    Order Specific Question:   Supervising Provider    Answer:   Cassandria Anger [[id number]]  . SYNTHROID 175 MCG tablet    Sig: Take 1 tablet (175 mcg total) by mouth daily before breakfast.    Dispense:  90 tablet    Refill:  3    Order Specific Question:   Supervising Provider    Answer:   Cassandria Anger [[id number]]  . lisinopril (PRINIVIL,ZESTRIL) 20 MG tablet    Sig: Take 1 tablet (20 mg total) by mouth 2 (two) times daily.    Dispense:  180 tablet    Refill:  3    Order Specific Question:   Supervising Provider    Answer:   Cassandria Anger [[id number]]  . glimepiride (AMARYL) 4 MG tablet    Sig: Take 1 tablet (4 mg total) by mouth daily before breakfast.    Dispense:  90 tablet    Refill:  0    Order Specific Question:   Supervising Provider    Answer:   Cassandria Anger [[id number]]  . potassium chloride SA  (K-DUR,KLOR-CON) 20 MEQ tablet    Sig: Take 1 tablet (20 mEq total) by mouth daily.    Dispense:  90 tablet    Refill:  3    Order Specific Question:   Supervising Provider    Answer:   Cassandria Anger [[id number]]    Follow-up: Return in about 6 months (around 12/07/2017) for DM and HTN, hyperlipidemia.  Wilfred Lacy, NP

## 2017-06-23 ENCOUNTER — Encounter: Payer: Self-pay | Admitting: Gastroenterology

## 2017-07-08 ENCOUNTER — Other Ambulatory Visit: Payer: Self-pay | Admitting: Nurse Practitioner

## 2017-07-08 DIAGNOSIS — I10 Essential (primary) hypertension: Secondary | ICD-10-CM

## 2017-08-02 ENCOUNTER — Ambulatory Visit (AMBULATORY_SURGERY_CENTER): Payer: Self-pay | Admitting: *Deleted

## 2017-08-02 VITALS — Ht 68.0 in | Wt 293.0 lb

## 2017-08-02 DIAGNOSIS — Z1211 Encounter for screening for malignant neoplasm of colon: Secondary | ICD-10-CM

## 2017-08-02 MED ORDER — NA SULFATE-K SULFATE-MG SULF 17.5-3.13-1.6 GM/177ML PO SOLN: 1.0000 | Freq: Once | ORAL | 0 refills | Status: AC

## 2017-08-02 NOTE — Progress Notes (Signed)
No egg or soy allergy known to patient  No issues with past sedation with any surgeries  or procedures, no intubation problems  No diet pills per patient No home 02 use per patient  No blood thinners per patient  Pt denies issues with constipation  No A fib or A flutter  EMMI video sent to pt's e mail  

## 2017-08-09 ENCOUNTER — Encounter: Payer: Self-pay | Admitting: Gastroenterology

## 2017-08-16 ENCOUNTER — Ambulatory Visit (AMBULATORY_SURGERY_CENTER): Payer: Federal, State, Local not specified - PPO | Admitting: Gastroenterology

## 2017-08-16 ENCOUNTER — Encounter: Payer: Self-pay | Admitting: Gastroenterology

## 2017-08-16 VITALS — BP 107/62 | HR 87 | Temp 98.9°F | Resp 16 | Ht 68.0 in | Wt 293.0 lb

## 2017-08-16 DIAGNOSIS — Z1212 Encounter for screening for malignant neoplasm of rectum: Secondary | ICD-10-CM | POA: Diagnosis not present

## 2017-08-16 DIAGNOSIS — K5730 Diverticulosis of large intestine without perforation or abscess without bleeding: Secondary | ICD-10-CM

## 2017-08-16 DIAGNOSIS — Z1211 Encounter for screening for malignant neoplasm of colon: Secondary | ICD-10-CM

## 2017-08-16 MED ORDER — SODIUM CHLORIDE 0.9 % IV SOLN: 500.0000 mL | INTRAVENOUS | Status: DC

## 2017-08-16 NOTE — Patient Instructions (Signed)
**  Handout given on Diverticulosis**   YOU HAD AN ENDOSCOPIC PROCEDURE TODAY: Refer to the procedure report and other information in the discharge instructions given to you for any specific questions about what was found during the examination. If this information does not answer your questions, please call [place] office at [phone] to clarify.   YOU SHOULD EXPECT: Some feelings of bloating in the abdomen. Passage of more gas than usual. Walking can help get rid of the air that was put into your GI tract during the procedure and reduce the bloating. If you had a lower endoscopy (such as a colonoscopy or flexible sigmoidoscopy) you may notice spotting of blood in your stool or on the toilet paper. Some abdominal soreness may be present for a day or two, also.  DIET: Your first meal following the procedure should be a light meal and then it is ok to progress to your normal diet. A half-sandwich or bowl of soup is an example of a good first meal. Heavy or fried foods are harder to digest and may make you feel nauseous or bloated. Drink plenty of fluids but you should avoid alcoholic beverages for 24 hours. If you had a esophageal dilation, please see attached instructions for diet.    ACTIVITY: Your care partner should take you home directly after the procedure. You should plan to take it easy, moving slowly for the rest of the day. You can resume normal activity the day after the procedure however YOU SHOULD NOT DRIVE, use power tools, machinery or perform tasks that involve climbing or major physical exertion for 24 hours (because of the sedation medicines used during the test).   SYMPTOMS TO REPORT IMMEDIATELY: A gastroenterologist can be reached at any hour. Please call [phone]  for any of the following symptoms:  Following lower endoscopy (colonoscopy, flexible sigmoidoscopy) Excessive amounts of blood in the stool  Significant tenderness, worsening of abdominal pains  Swelling of the  abdomen that is new, acute  Fever of 100 or higher    FOLLOW UP:  If any biopsies were taken you will be contacted by phone or by letter within the next 1-3 weeks. Call [phone]  if you have not heard about the biopsies in 3 weeks.  Please also call with any specific questions about appointments or follow up tests.  

## 2017-08-16 NOTE — Progress Notes (Signed)
Report given to PACU, vss 

## 2017-08-16 NOTE — Op Note (Signed)
[facility] Patient Name: Emily Zavala Procedure Date: 08/16/2017 8:17 AM MRN: [id number] Endoscopist: Rachael Fee , MD Age: 51 Referring MD:  Date of Birth: [birth year] Gender: Female Account #: [id number] Procedure:                Colonoscopy Indications:              Screening for colorectal malignant neoplasm Medicines:                Monitored Anesthesia Care Procedure:                Pre-Anesthesia Assessment:                           - Prior to the procedure, a History and Physical                            was performed, and patient medications and                            allergies were reviewed. The patient's tolerance of                            previous anesthesia was also reviewed. The risks                            and benefits of the procedure and the sedation                            options and risks were discussed with the patient.                            All questions were answered, and informed consent                            was obtained. Prior Anticoagulants: The patient has                            taken no previous anticoagulant or antiplatelet                            agents. ASA Grade Assessment: II - A patient with                            mild systemic disease. After reviewing the risks                            and benefits, the patient was deemed in                            satisfactory condition to undergo the procedure.                           - Prior to the procedure, a History and Physical  was performed, and patient medications and                            allergies were reviewed. The patient's tolerance of                            previous anesthesia was also reviewed. The risks                            and benefits of the procedure and the sedation                            options and risks were discussed with the patient.                            All questions were  answered, and informed consent                            was obtained. Prior Anticoagulants: The patient has                            taken no previous anticoagulant or antiplatelet                            agents. ASA Grade Assessment: III - A patient with                            severe systemic disease. After reviewing the risks                            and benefits, the patient was deemed in                            satisfactory condition to undergo the procedure.                           After obtaining informed consent, the colonoscope                            was passed under direct vision. Throughout the                            procedure, the patient's blood pressure, pulse, and                            oxygen saturations were monitored continuously. The                            Model PCF-[id number]) scope was introduced                            through the anus and advanced to the the cecum,  identified by appendiceal orifice and ileocecal                            valve. The colonoscopy was performed without                            difficulty. The patient tolerated the procedure                            well. The quality of the bowel preparation was                            excellent. The ileocecal valve, appendiceal                            orifice, and rectum were photographed. Scope In: 8:25:10 AM Scope Out: 8:40:15 AM Scope Withdrawal Time: 0 hours 12 minutes 16 seconds  Total Procedure Duration: 0 hours 15 minutes 5 seconds  Findings:                 Multiple small and large-mouthed diverticula were                            found in the entire colon.                           The exam was otherwise without abnormality on                            direct and retroflexion views. Complications:            No immediate complications. Estimated blood loss:                            None. Estimated Blood  Loss:     Estimated blood loss: none. Impression:               - Diverticulosis in the entire examined colon.                           - The examination was otherwise normal on direct                            and retroflexion views.                           - No specimens collected. Recommendation:           - Patient has a contact number available for                            emergencies. The signs and symptoms of potential                            delayed complications were discussed with the  patient. Return to normal activities tomorrow.                            Written discharge instructions were provided to the                            patient.                           - Resume previous diet.                           - Continue present medications.                           - Repeat colonoscopy in 10 years for screening                            purposes. There is no need for colon cancer                            screening by any method (including stool testing)                            prior to then. Rachael Feeaniel P Jacobs, MD 08/16/2017 8:44:34 AM This report has been signed electronically.

## 2017-08-17 ENCOUNTER — Telehealth: Payer: Self-pay | Admitting: *Deleted

## 2017-08-17 NOTE — Telephone Encounter (Signed)
No answer for post procedure call back. Will attempt to call back later this afternoon. SM 

## 2017-08-17 NOTE — Telephone Encounter (Signed)
  Follow up Call-  Call back number 08/16/2017  Post procedure Call Back phone  # [phone]  Permission to leave phone message Yes  Some recent data might be hidden     Patient questions:  Do you have a fever, pain , or abdominal swelling? No. Pain Score  0 *  Have you tolerated food without any problems? Yes.    Have you been able to return to your normal activities? Yes.    Do you have any questions about your discharge instructions: Diet   No. Medications  No. Follow up visit  No.  Do you have questions or concerns about your Care? No.  Actions: * If pain score is 4 or above: No action needed, pain <4.

## 2017-08-28 ENCOUNTER — Other Ambulatory Visit: Payer: Self-pay | Admitting: Nurse Practitioner

## 2017-08-28 ENCOUNTER — Ambulatory Visit: Payer: Federal, State, Local not specified - PPO | Admitting: Nurse Practitioner

## 2017-08-28 ENCOUNTER — Encounter: Payer: Self-pay | Admitting: Nurse Practitioner

## 2017-08-28 VITALS — BP 140/84 | HR 81 | Temp 98.5°F | Ht 68.0 in | Wt 294.0 lb

## 2017-08-28 DIAGNOSIS — J069 Acute upper respiratory infection, unspecified: Secondary | ICD-10-CM

## 2017-08-28 MED ORDER — IPRATROPIUM BROMIDE 0.03 % NA SOLN: 2.0000 | Freq: Two times a day (BID) | NASAL | 0 refills | Status: DC

## 2017-08-28 MED ORDER — DM-GUAIFENESIN ER 30-600 MG PO TB12: 1.0000 | ORAL_TABLET | Freq: Two times a day (BID) | ORAL | 0 refills | Status: DC | PRN

## 2017-08-28 MED ORDER — ALBUTEROL SULFATE (2.5 MG/3ML) 0.083% IN NEBU
2.5000 mg | INHALATION_SOLUTION | Freq: Once | RESPIRATORY_TRACT | Status: AC
  Administered 2017-08-28: 2.5 mg via RESPIRATORY_TRACT

## 2017-08-28 MED ORDER — ACETAMINOPHEN 500 MG PO TABS: 500.0000 mg | ORAL_TABLET | Freq: Four times a day (QID) | ORAL | 0 refills | Status: DC | PRN

## 2017-08-28 MED ORDER — PROMETHAZINE-DM 6.25-15 MG/5ML PO SYRP: 5.0000 mL | ORAL_SOLUTION | Freq: Three times a day (TID) | ORAL | 0 refills | Status: DC | PRN

## 2017-08-28 MED ORDER — ALBUTEROL SULFATE (2.5 MG/3ML) 0.083% IN NEBU: 2.5000 mg | INHALATION_SOLUTION | Freq: Once | RESPIRATORY_TRACT | Status: DC

## 2017-08-28 MED ORDER — BENZONATATE 100 MG PO CAPS: 100.0000 mg | ORAL_CAPSULE | Freq: Three times a day (TID) | ORAL | 0 refills | Status: DC | PRN

## 2017-08-28 NOTE — Progress Notes (Signed)
Subjective:  Patient ID: Emily Zavala, female    DOB: 06-15-1966  Age: 51 y.o. MRN: [id number]  CC: Nasal Congestion (bodyache,very weak,congestion greens mucus,diarrhea,cant eat,cant smell/ 3 days. )   URI   This is a new problem. The current episode started in the past 7 days. The problem has been unchanged. There has been no fever. Associated symptoms include congestion, coughing, headaches, joint pain, rhinorrhea, sinus pain, a sore throat and wheezing. She has tried nothing for the symptoms.   URI: contact with step father with similar symptoms. Soft stools.  Outpatient Medications Prior to Visit  Medication Sig Dispense Refill  . ACCU-CHEK AVIVA PLUS test strip USE TO TEST BLOOD SUGAR ONCE D BEFORE BREAKFAST  1  . albuterol (PROVENTIL HFA;VENTOLIN HFA) 108 (90 Base) MCG/ACT inhaler Inhale 1 puff into the lungs every 6 (six) hours as needed for wheezing or shortness of breath. 1 Inhaler 2  . amLODipine (NORVASC) 5 MG tablet Take 1 tablet (5 mg total) by mouth daily. 90 tablet 3  . atorvastatin (LIPITOR) 20 MG tablet Take 1 tablet (20 mg total) by mouth daily. 90 tablet 3  . blood glucose meter kit and supplies KIT Dispense based on patient and insurance preference. Use once a day (before breakfast) as directed. 1 each 1  . glimepiride (AMARYL) 4 MG tablet Take 1 tablet (4 mg total) by mouth daily before breakfast. 90 tablet 0  . hydrochlorothiazide (HYDRODIURIL) 25 MG tablet Take 1 tablet (25 mg total) by mouth daily. 90 tablet 3  . Lancets Misc. (UNISTIK 3) MISC 1 Units by Does not apply route 2 (two) times daily before a meal. Low flow, I.64m, 28G 100 each 11  . levothyroxine (SYNTHROID, LEVOTHROID) 175 MCG tablet Take 175 mcg by mouth daily before breakfast.    . lisinopril (PRINIVIL,ZESTRIL) 20 MG tablet Take 1 tablet (20 mg total) by mouth 2 (two) times daily. 180 tablet 3  . potassium chloride SA (K-DUR,KLOR-CON) 20 MEQ tablet Take 1 tablet (20 mEq total) by mouth daily. 90  tablet 3   No facility-administered medications prior to visit.     ROS See HPI  Objective:  BP 140/84   Pulse 81   Temp 98.5 F (36.9 C)   Ht 5' 8"  (1.727 m)   Wt 294 lb (133.4 kg)   SpO2 96%   BMI 44.70 kg/m   BP Readings from Last 3 Encounters:  08/28/17 140/84  08/16/17 107/62  06/09/17 136/86    Wt Readings from Last 3 Encounters:  08/28/17 294 lb (133.4 kg)  08/16/17 293 lb (132.9 kg)  08/02/17 293 lb (132.9 kg)    Physical Exam  Constitutional: She is oriented to person, place, and time. No distress.  HENT:  Right Ear: Tympanic membrane, external ear and ear canal normal.  Left Ear: Tympanic membrane, external ear and ear canal normal.  Nose: Mucosal edema and rhinorrhea present. Right sinus exhibits no maxillary sinus tenderness and no frontal sinus tenderness. Left sinus exhibits no maxillary sinus tenderness and no frontal sinus tenderness.  Mouth/Throat: Uvula is midline. Posterior oropharyngeal erythema present.  Cardiovascular: Normal rate.  Pulmonary/Chest: Effort normal. She has wheezes. She has no rales.  Clear lung sounds after nebulizer treatment  Abdominal: Soft. She exhibits no distension. There is no tenderness.  Neurological: She is alert and oriented to person, place, and time.  Vitals reviewed.   Lab Results  Component Value Date   WBC 5.0 12/03/2016   HGB 13.6 12/03/2016  HCT 42.7 12/03/2016   PLT 272 12/03/2016   GLUCOSE 92 06/09/2017   CHOL 157 06/09/2017   TRIG 103.0 06/09/2017   HDL 52.40 06/09/2017   LDLCALC 84 06/09/2017   ALT 23 06/09/2017   AST 18 06/09/2017   NA 141 06/09/2017   K 3.2 (L) 06/09/2017   CL 100 06/09/2017   CREATININE 0.78 06/09/2017   BUN 12 06/09/2017   CO2 34 (H) 06/09/2017   TSH 2.42 12/19/2016   HGBA1C 6.4 06/09/2017    Mm Screening Breast Tomo Bilateral  Result Date: 02/23/2017 CLINICAL DATA:  Screening. EXAM: 2D DIGITAL SCREENING BILATERAL MAMMOGRAM WITH CAD AND ADJUNCT TOMO COMPARISON:  No  prior films ACR Breast Density Category b: There are scattered areas of fibroglandular density. FINDINGS: There are no findings suspicious for malignancy. Images were processed with CAD. IMPRESSION: No mammographic evidence of malignancy. A result letter of this screening mammogram will be mailed directly to the patient. RECOMMENDATION: Screening mammogram in one year. (Code:SM-B-01Y) BI-RADS CATEGORY  1: Negative. Electronically Signed   By: Abelardo Diesel M.D.   On: 02/23/2017 10:26    Assessment & Plan:   Emily Zavala was seen today for nasal congestion.  Diagnoses and all orders for this visit:  Acute URI -     Discontinue: albuterol (PROVENTIL) (2.5 MG/3ML) 0.083% nebulizer solution 2.5 mg -     ipratropium (ATROVENT) 0.03 % nasal spray; Place 2 sprays into both nostrils 2 (two) times daily. Do not use for more than 5days. -     dextromethorphan-guaiFENesin (MUCINEX DM) 30-600 MG 12hr tablet; Take 1 tablet by mouth 2 (two) times daily as needed for cough. -     Discontinue: promethazine-dextromethorphan (PROMETHAZINE-DM) 6.25-15 MG/5ML syrup; Take 5 mLs by mouth 3 (three) times daily as needed for cough. -     acetaminophen (TYLENOL) 500 MG tablet; Take 1 tablet (500 mg total) by mouth every 6 (six) hours as needed. -     albuterol (PROVENTIL) (2.5 MG/3ML) 0.083% nebulizer solution 2.5 mg -     benzonatate (TESSALON) 100 MG capsule; Take 1 capsule (100 mg total) by mouth 3 (three) times daily as needed for cough.   I have discontinued Emily Zavala's promethazine-dextromethorphan. I am also having her start on ipratropium, dextromethorphan-guaiFENesin, acetaminophen, and benzonatate. Additionally, I am having her maintain her albuterol, blood glucose meter kit and supplies, ACCU-CHEK AVIVA PLUS, UNISTIK 3, amLODipine, atorvastatin, hydrochlorothiazide, lisinopril, glimepiride, potassium chloride SA, and levothyroxine. We administered albuterol.  Meds ordered this encounter  Medications  .  DISCONTD: albuterol (PROVENTIL) (2.5 MG/3ML) 0.083% nebulizer solution 2.5 mg  . ipratropium (ATROVENT) 0.03 % nasal spray    Sig: Place 2 sprays into both nostrils 2 (two) times daily. Do not use for more than 5days.    Dispense:  30 mL    Refill:  0    Order Specific Question:   Supervising Provider    Answer:   Lucille Passy [[id number]]  . dextromethorphan-guaiFENesin (MUCINEX DM) 30-600 MG 12hr tablet    Sig: Take 1 tablet by mouth 2 (two) times daily as needed for cough.    Dispense:  14 tablet    Refill:  0    Order Specific Question:   Supervising Provider    Answer:   Lucille Passy [[id number]]  . DISCONTD: promethazine-dextromethorphan (PROMETHAZINE-DM) 6.25-15 MG/5ML syrup    Sig: Take 5 mLs by mouth 3 (three) times daily as needed for cough.    Dispense:  240 mL  Refill:  0    Order Specific Question:   Supervising Provider    Answer:   Lucille Passy [[id number]]  . acetaminophen (TYLENOL) 500 MG tablet    Sig: Take 1 tablet (500 mg total) by mouth every 6 (six) hours as needed.    Dispense:  30 tablet    Refill:  0    Order Specific Question:   Supervising Provider    Answer:   Lucille Passy [[id number]]  . albuterol (PROVENTIL) (2.5 MG/3ML) 0.083% nebulizer solution 2.5 mg  . benzonatate (TESSALON) 100 MG capsule    Sig: Take 1 capsule (100 mg total) by mouth 3 (three) times daily as needed for cough.    Dispense:  20 capsule    Refill:  0    Order Specific Question:   Supervising Provider    Answer:   Lucille Passy [[id number]]    Follow-up: No Follow-up on file.  Wilfred Lacy, NP

## 2017-08-28 NOTE — Patient Instructions (Signed)
Encourage adequate oral hydration.  Upper Respiratory Infection, Adult Most upper respiratory infections (URIs) are caused by a virus. A URI affects the nose, throat, and upper air passages. The most common type of URI is often called "the common cold." Follow these instructions at home:  Take medicines only as told by your doctor.  Gargle warm saltwater or take cough drops to comfort your throat as told by your doctor.  Use a warm mist humidifier or inhale steam from a shower to increase air moisture. This may make it easier to breathe.  Drink enough fluid to keep your pee (urine) clear or pale yellow.  Eat soups and other clear broths.  Have a healthy diet.  Rest as needed.  Go back to work when your fever is gone or your doctor says it is okay. ? You may need to stay home longer to avoid giving your URI to others. ? You can also wear a face mask and wash your hands often to prevent spread of the virus.  Use your inhaler more if you have asthma.  Do not use any tobacco products, including cigarettes, chewing tobacco, or electronic cigarettes. If you need help quitting, ask your doctor. Contact a doctor if:  You are getting worse, not better.  Your symptoms are not helped by medicine.  You have chills.  You are getting more short of breath.  You have brown or red mucus.  You have yellow or brown discharge from your nose.  You have pain in your face, especially when you bend forward.  You have a fever.  You have puffy (swollen) neck glands.  You have pain while swallowing.  You have white areas in the back of your throat. Get help right away if:  You have very bad or constant: ? Headache. ? Ear pain. ? Pain in your forehead, behind your eyes, and over your cheekbones (sinus pain). ? Chest pain.  You have long-lasting (chronic) lung disease and any of the following: ? Wheezing. ? Long-lasting cough. ? Coughing up blood. ? A change in your usual  mucus.  You have a stiff neck.  You have changes in your: ? Vision. ? Hearing. ? Thinking. ? Mood. This information is not intended to replace advice given to you by your health care provider. Make sure you discuss any questions you have with your health care provider. Document Released: 03/07/2008 Document Revised: 05/22/2016 Document Reviewed: 12/25/2013 Elsevier Interactive Patient Education  2018 [employer]

## 2017-08-29 ENCOUNTER — Encounter: Payer: Self-pay | Admitting: Nurse Practitioner

## 2017-09-13 ENCOUNTER — Other Ambulatory Visit: Payer: Self-pay | Admitting: Nurse Practitioner

## 2017-09-13 DIAGNOSIS — E118 Type 2 diabetes mellitus with unspecified complications: Secondary | ICD-10-CM

## 2017-12-02 ENCOUNTER — Other Ambulatory Visit: Payer: Self-pay | Admitting: Nurse Practitioner

## 2017-12-02 DIAGNOSIS — I10 Essential (primary) hypertension: Secondary | ICD-10-CM

## 2017-12-02 DIAGNOSIS — E118 Type 2 diabetes mellitus with unspecified complications: Secondary | ICD-10-CM

## 2017-12-08 ENCOUNTER — Ambulatory Visit: Payer: Federal, State, Local not specified - PPO | Admitting: Nurse Practitioner

## 2017-12-10 ENCOUNTER — Other Ambulatory Visit: Payer: Self-pay | Admitting: Nurse Practitioner

## 2017-12-10 DIAGNOSIS — E039 Hypothyroidism, unspecified: Secondary | ICD-10-CM

## 2017-12-13 ENCOUNTER — Telehealth: Payer: Self-pay | Admitting: Nurse Practitioner

## 2017-12-13 DIAGNOSIS — E039 Hypothyroidism, unspecified: Secondary | ICD-10-CM

## 2017-12-13 NOTE — Telephone Encounter (Signed)
Copied from CRM [id number]. Topic: Quick Communication - See Telephone Encounter >> Dec 13, 2017  4:12 PM Windy KalataMichael, Nur Rabold L, NT wrote: CRM for notification. See Telephone encounter for:  12/13/17.  Patient is calling and states the Synthroid is too expensive for her, she is requesting to be changed back to Levothyroxine. Patient is also requesting a call back from [place]. Please advise.  [facility] [phone] [address] [place]- [place], [place] - [id number] [facility] [address]  [address] [place] [id number]  Phone: [phone] Fax: [phone]

## 2017-12-13 NOTE — Telephone Encounter (Signed)
Ok to switch 

## 2017-12-14 MED ORDER — LEVOTHYROXINE SODIUM 175 MCG PO TABS: ORAL_TABLET | ORAL | 0 refills | Status: DC

## 2017-12-14 NOTE — Telephone Encounter (Signed)
Spoke with pharmacy, they didn't get rx that we sent in on 12/10/2017.   Resend levothyroxine 90 tab as pt requested.

## 2017-12-15 ENCOUNTER — Encounter: Payer: Self-pay | Admitting: Nurse Practitioner

## 2017-12-15 ENCOUNTER — Ambulatory Visit: Payer: Federal, State, Local not specified - PPO | Admitting: Nurse Practitioner

## 2017-12-15 VITALS — BP 138/76 | HR 78 | Temp 98.0°F | Ht 68.0 in | Wt 296.0 lb

## 2017-12-15 DIAGNOSIS — R350 Frequency of micturition: Secondary | ICD-10-CM | POA: Diagnosis not present

## 2017-12-15 DIAGNOSIS — I10 Essential (primary) hypertension: Secondary | ICD-10-CM

## 2017-12-15 DIAGNOSIS — T502X5A Adverse effect of carbonic-anhydrase inhibitors, benzothiadiazides and other diuretics, initial encounter: Secondary | ICD-10-CM

## 2017-12-15 DIAGNOSIS — E118 Type 2 diabetes mellitus with unspecified complications: Secondary | ICD-10-CM

## 2017-12-15 DIAGNOSIS — E782 Mixed hyperlipidemia: Secondary | ICD-10-CM | POA: Diagnosis not present

## 2017-12-15 DIAGNOSIS — E876 Hypokalemia: Secondary | ICD-10-CM

## 2017-12-15 DIAGNOSIS — R300 Dysuria: Secondary | ICD-10-CM | POA: Diagnosis not present

## 2017-12-15 DIAGNOSIS — E039 Hypothyroidism, unspecified: Secondary | ICD-10-CM | POA: Diagnosis not present

## 2017-12-15 LAB — HEPATIC FUNCTION PANEL
ALT: 25 U/L (ref 0–35)
AST: 20 U/L (ref 0–37)
Albumin: 4.4 g/dL (ref 3.5–5.2)
Alkaline Phosphatase: 76 U/L (ref 39–117)
Bilirubin, Direct: 0.1 mg/dL (ref 0.0–0.3)
Total Bilirubin: 0.7 mg/dL (ref 0.2–1.2)
Total Protein: 7.1 g/dL (ref 6.0–8.3)

## 2017-12-15 LAB — POCT URINALYSIS DIPSTICK
Bilirubin, UA: NEGATIVE
Glucose, UA: NEGATIVE
Ketones, UA: NEGATIVE
LEUKOCYTES UA: NEGATIVE
Nitrite, UA: NEGATIVE
PH UA: 6 (ref 5.0–8.0)
Protein, UA: NEGATIVE
Spec Grav, UA: 1.02 (ref 1.010–1.025)
UROBILINOGEN UA: 0.2 U/dL

## 2017-12-15 LAB — LIPID PANEL
CHOLESTEROL: 182 mg/dL (ref 0–200)
HDL: 56.7 mg/dL (ref >39.00)
LDL Cholesterol: 108 mg/dL — ABNORMAL HIGH (ref 0–99)
NonHDL: 125.47
TRIGLYCERIDES: 89 mg/dL (ref 0.0–149.0)
Total CHOL/HDL Ratio: 3
VLDL: 17.8 mg/dL (ref 0.0–40.0)

## 2017-12-15 LAB — MICROALBUMIN / CREATININE URINE RATIO
Creatinine,U: 96.2 mg/dL
MICROALB UR: 1.6 mg/dL (ref 0.0–1.9)
MICROALB/CREAT RATIO: 1.6 mg/g (ref 0.0–30.0)

## 2017-12-15 LAB — BASIC METABOLIC PANEL
BUN: 18 mg/dL (ref 6–23)
CO2: 34 mEq/L — ABNORMAL HIGH (ref 19–32)
CREATININE: 0.78 mg/dL (ref 0.40–1.20)
Calcium: 10.1 mg/dL (ref 8.4–10.5)
Chloride: 99 mEq/L (ref 96–112)
GFR: 100.17 mL/min (ref >60.00)
Glucose, Bld: 96 mg/dL (ref 70–99)
Potassium: 3.6 mEq/L (ref 3.5–5.1)
Sodium: 139 mEq/L (ref 135–145)

## 2017-12-15 LAB — T4, FREE: FREE T4: 1.08 ng/dL (ref 0.60–1.60)

## 2017-12-15 LAB — TSH: TSH: 0.03 u[IU]/mL — AB (ref 0.35–4.50)

## 2017-12-15 LAB — HEMOGLOBIN A1C: Hgb A1c MFr Bld: 6.5 % (ref 4.6–6.5)

## 2017-12-15 MED ORDER — AMLODIPINE BESYLATE 5 MG PO TABS: 5.0000 mg | ORAL_TABLET | Freq: Every day | ORAL | 1 refills | Status: DC

## 2017-12-15 MED ORDER — POTASSIUM CHLORIDE CRYS ER 20 MEQ PO TBCR: 20.0000 meq | EXTENDED_RELEASE_TABLET | Freq: Every day | ORAL | 1 refills | Status: DC

## 2017-12-15 MED ORDER — HYDROCHLOROTHIAZIDE 25 MG PO TABS: 25.0000 mg | ORAL_TABLET | Freq: Every day | ORAL | 1 refills | Status: DC

## 2017-12-15 MED ORDER — PHENAZOPYRIDINE HCL 100 MG PO TABS: 100.0000 mg | ORAL_TABLET | Freq: Three times a day (TID) | ORAL | 0 refills | Status: DC | PRN

## 2017-12-15 MED ORDER — GLIMEPIRIDE 4 MG PO TABS: ORAL_TABLET | ORAL | 1 refills | Status: DC

## 2017-12-15 MED ORDER — ATORVASTATIN CALCIUM 20 MG PO TABS: 20.0000 mg | ORAL_TABLET | Freq: Every day | ORAL | 1 refills | Status: DC

## 2017-12-15 MED ORDER — LISINOPRIL 20 MG PO TABS: 20.0000 mg | ORAL_TABLET | Freq: Two times a day (BID) | ORAL | 1 refills | Status: DC

## 2017-12-15 MED ORDER — LEVOTHYROXINE SODIUM 150 MCG PO TABS
ORAL_TABLET | ORAL | 1 refills | Status: DC
Start: 2017-12-15 — End: 2018-06-25

## 2017-12-15 NOTE — Progress Notes (Signed)
Subjective:  Patient ID: Emily Zavala, female    DOB: Dec 08, 1965  Age: 52 y.o. MRN: [id number]  CC: Follow-up (DM,HTN,Pipid/fasting/tdap?) and Urinary Tract Infection (frequent urinate,hot and cold/1 wk)  HPI  DM: Does not check glucose at home. Current use of amaryl. No hypoglycemia.  HTN: Controlled with amlodipine, lisinopril, HCTZ. BP Readings from Last 3 Encounters:  12/15/17 138/76  08/28/17 140/84  08/16/17 107/62   Hypothyrodism: Continues weight gain. No fatigue, no palpitation, no tremor, no change in memory.  Outpatient Medications Prior to Visit  Medication Sig Dispense Refill  . ACCU-CHEK AVIVA PLUS test strip USE TO TEST BLOOD SUGAR ONCE D BEFORE BREAKFAST  1  . acetaminophen (TYLENOL) 500 MG tablet Take 1 tablet (500 mg total) by mouth every 6 (six) hours as needed. 30 tablet 0  . albuterol (PROVENTIL HFA;VENTOLIN HFA) 108 (90 Base) MCG/ACT inhaler Inhale 1 puff into the lungs every 6 (six) hours as needed for wheezing or shortness of breath. 1 Inhaler 2  . blood glucose meter kit and supplies KIT Dispense based on patient and insurance preference. Use once a day (before breakfast) as directed. 1 each 1  . Lancets Misc. (UNISTIK 3) MISC 1 Units by Does not apply route 2 (two) times daily before a meal. Low flow, I.77m, 28G 100 each 11  . amLODipine (NORVASC) 5 MG tablet Take 1 tablet (5 mg total) by mouth daily. 90 tablet 3  . atorvastatin (LIPITOR) 20 MG tablet Take 1 tablet (20 mg total) by mouth daily. 90 tablet 3  . glimepiride (AMARYL) 4 MG tablet TAKE 1 TABLET(4 MG) BY MOUTH DAILY BEFORE BREAKFAST 90 tablet 0  . hydrochlorothiazide (HYDRODIURIL) 25 MG tablet TAKE 1 TABLET BY MOUTH DAILY 90 tablet 0  . levothyroxine (SYNTHROID, LEVOTHROID) 175 MCG tablet TAKE 1 TABLET(175 MCG) BY MOUTH DAILY BEFORE BREAKFAST 90 tablet 0  . lisinopril (PRINIVIL,ZESTRIL) 20 MG tablet Take 1 tablet (20 mg total) by mouth 2 (two) times daily. 180 tablet 3  . potassium  chloride SA (K-DUR,KLOR-CON) 20 MEQ tablet Take 1 tablet (20 mEq total) by mouth daily. 90 tablet 3  . benzonatate (TESSALON) 100 MG capsule Take 1 capsule (100 mg total) by mouth 3 (three) times daily as needed for cough. (Patient not taking: Reported on 12/15/2017) 20 capsule 0  . dextromethorphan-guaiFENesin (MUCINEX DM) 30-600 MG 12hr tablet Take 1 tablet by mouth 2 (two) times daily as needed for cough. (Patient not taking: Reported on 12/15/2017) 14 tablet 0  . ipratropium (ATROVENT) 0.03 % nasal spray Place 2 sprays into both nostrils 2 (two) times daily. Do not use for more than 5days. (Patient not taking: Reported on 12/15/2017) 30 mL 0   No facility-administered medications prior to visit.     ROS See HPI  Objective:  BP 138/76   Pulse 78   Temp 98 F (36.7 C) (Oral)   Ht 5' 8"  (1.727 m)   Wt 296 lb (134.3 kg)   SpO2 97%   BMI 45.01 kg/m   BP Readings from Last 3 Encounters:  12/15/17 138/76  08/28/17 140/84  08/16/17 107/62    Wt Readings from Last 3 Encounters:  12/15/17 296 lb (134.3 kg)  08/28/17 294 lb (133.4 kg)  08/16/17 293 lb (132.9 kg)    Physical Exam  Constitutional: She is oriented to person, place, and time. No distress.  Neck: Normal range of motion. Neck supple. No thyromegaly present.  Cardiovascular: Normal rate, regular rhythm and normal heart sounds.  Pulmonary/Chest:  Effort normal and breath sounds normal.  Musculoskeletal: She exhibits edema.  Neurological: She is alert and oriented to person, place, and time.  Skin: Skin is warm and dry. No rash noted. No erythema.  normal diabetic foot exam  Psychiatric: She has a normal mood and affect.  Vitals reviewed.   Lab Results  Component Value Date   WBC 5.0 12/03/2016   HGB 13.6 12/03/2016   HCT 42.7 12/03/2016   PLT 272 12/03/2016   GLUCOSE 96 12/15/2017   CHOL 182 12/15/2017   TRIG 89.0 12/15/2017   HDL 56.70 12/15/2017   LDLCALC 108 (H) 12/15/2017   ALT 25 12/15/2017   AST 20  12/15/2017   NA 139 12/15/2017   K 3.6 12/15/2017   CL 99 12/15/2017   CREATININE 0.78 12/15/2017   BUN 18 12/15/2017   CO2 34 (H) 12/15/2017   TSH 0.03 (L) 12/15/2017   HGBA1C 6.5 12/15/2017   MICROALBUR 1.6 12/15/2017    Mm Screening Breast Tomo Bilateral  Result Date: 02/23/2017 CLINICAL DATA:  Screening. EXAM: 2D DIGITAL SCREENING BILATERAL MAMMOGRAM WITH CAD AND ADJUNCT TOMO COMPARISON:  No prior films ACR Breast Density Category b: There are scattered areas of fibroglandular density. FINDINGS: There are no findings suspicious for malignancy. Images were processed with CAD. IMPRESSION: No mammographic evidence of malignancy. A result letter of this screening mammogram will be mailed directly to the patient. RECOMMENDATION: Screening mammogram in one year. (Code:SM-B-01Y) BI-RADS CATEGORY  1: Negative. Electronically Signed   By: Abelardo Diesel M.D.   On: 02/23/2017 10:26    Assessment & Plan:   Emily Zavala was seen today for follow-up and urinary tract infection.  Diagnoses and all orders for this visit:  Frequent urination -     POCT urinalysis dipstick -     Urine Culture -     phenazopyridine (PYRIDIUM) 100 MG tablet; Take 1 tablet (100 mg total) by mouth 3 (three) times daily as needed for pain (with food).  Dysuria -     Urine Culture -     phenazopyridine (PYRIDIUM) 100 MG tablet; Take 1 tablet (100 mg total) by mouth 3 (three) times daily as needed for pain (with food).  Essential hypertension, benign -     Basic metabolic panel -     amLODipine (NORVASC) 5 MG tablet; Take 1 tablet (5 mg total) by mouth daily. -     hydrochlorothiazide (HYDRODIURIL) 25 MG tablet; Take 1 tablet (25 mg total) by mouth daily. -     lisinopril (PRINIVIL,ZESTRIL) 20 MG tablet; Take 1 tablet (20 mg total) by mouth 2 (two) times daily.  Type 2 diabetes mellitus with complication, without long-term current use of insulin (HCC) -     Basic metabolic panel -     Hemoglobin A1c -     Microalbumin  / creatinine urine ratio -     glimepiride (AMARYL) 4 MG tablet; TAKE 1 TABLET(4 MG) BY MOUTH DAILY BEFORE BREAKFAST  Hypothyroidism, unspecified type -     TSH -     T4, free -     levothyroxine (SYNTHROID, LEVOTHROID) 150 MCG tablet; TAKE 1 TABLET(175 MCG) BY MOUTH DAILY BEFORE BREAKFAST -     TSH; Future -     T4, free; Future  Mixed hyperlipidemia -     Hepatic function panel -     Lipid panel -     atorvastatin (LIPITOR) 20 MG tablet; Take 1 tablet (20 mg total) by mouth daily.  Diuretic-induced hypokalemia -  potassium chloride SA (K-DUR,KLOR-CON) 20 MEQ tablet; Take 1 tablet (20 mEq total) by mouth daily.   I have discontinued Phallon N. Odowd's ipratropium, dextromethorphan-guaiFENesin, and benzonatate. I have also changed her hydrochlorothiazide and levothyroxine. Additionally, I am having her start on phenazopyridine. Lastly, I am having her maintain her albuterol, blood glucose meter kit and supplies, ACCU-CHEK AVIVA PLUS, UNISTIK 3, acetaminophen, atorvastatin, amLODipine, glimepiride, lisinopril, and potassium chloride SA.  Meds ordered this encounter  Medications  . phenazopyridine (PYRIDIUM) 100 MG tablet    Sig: Take 1 tablet (100 mg total) by mouth 3 (three) times daily as needed for pain (with food).    Dispense:  10 tablet    Refill:  0    Order Specific Question:   Supervising Provider    Answer:   Lucille Passy [[id number]]  . atorvastatin (LIPITOR) 20 MG tablet    Sig: Take 1 tablet (20 mg total) by mouth daily.    Dispense:  90 tablet    Refill:  1    **Patient requests 90 days supply**    Order Specific Question:   Supervising Provider    Answer:   Lucille Passy [[id number]]  . amLODipine (NORVASC) 5 MG tablet    Sig: Take 1 tablet (5 mg total) by mouth daily.    Dispense:  90 tablet    Refill:  1    **Patient requests 90 days supply**    Order Specific Question:   Supervising Provider    Answer:   Lucille Passy [[id number]]  . glimepiride (AMARYL) 4 MG tablet     Sig: TAKE 1 TABLET(4 MG) BY MOUTH DAILY BEFORE BREAKFAST    Dispense:  90 tablet    Refill:  1    Order Specific Question:   Supervising Provider    Answer:   Lucille Passy [[id number]]  . hydrochlorothiazide (HYDRODIURIL) 25 MG tablet    Sig: Take 1 tablet (25 mg total) by mouth daily.    Dispense:  90 tablet    Refill:  1    Order Specific Question:   Supervising Provider    Answer:   Lucille Passy [[id number]]  . lisinopril (PRINIVIL,ZESTRIL) 20 MG tablet    Sig: Take 1 tablet (20 mg total) by mouth 2 (two) times daily.    Dispense:  180 tablet    Refill:  1    Order Specific Question:   Supervising Provider    Answer:   Lucille Passy [[id number]]  . potassium chloride SA (K-DUR,KLOR-CON) 20 MEQ tablet    Sig: Take 1 tablet (20 mEq total) by mouth daily.    Dispense:  90 tablet    Refill:  1    Order Specific Question:   Supervising Provider    Answer:   Lucille Passy [[id number]]  . levothyroxine (SYNTHROID, LEVOTHROID) 150 MCG tablet    Sig: TAKE 1 TABLET(175 MCG) BY MOUTH DAILY BEFORE BREAKFAST    Dispense:  90 tablet    Refill:  1    Ok to give generic.    Order Specific Question:   Supervising Provider    Answer:   Lucille Passy [[id number]]    Follow-up: Return in about 6 months (around 06/17/2018) for DM and HTN, hyperlipidemia.  Wilfred Lacy, NP

## 2017-12-15 NOTE — Patient Instructions (Addendum)
Please have ophthalmology report faxed to me.  Normal urine microalbumin. Stable BMP, lipid panel, hgbA1c and hepatic panel Low TSH and high T4. Decreased levothyroxine dose to 150mcg. Return to [place] in 6weeks for repeat labs.  Urine have been sent for culture.

## 2017-12-16 LAB — URINE CULTURE
MICRO NUMBER: 90331405
Result:: NO GROWTH
SPECIMEN QUALITY:: ADEQUATE

## 2017-12-28 LAB — HM DIABETES EYE EXAM

## 2018-01-01 ENCOUNTER — Encounter: Payer: Self-pay | Admitting: Nurse Practitioner

## 2018-01-01 NOTE — Progress Notes (Signed)
Abstracted, send to scan. TLG

## 2018-01-09 ENCOUNTER — Encounter: Payer: Self-pay | Admitting: Nurse Practitioner

## 2018-01-30 ENCOUNTER — Other Ambulatory Visit: Payer: Self-pay | Admitting: Nurse Practitioner

## 2018-01-30 DIAGNOSIS — Z1231 Encounter for screening mammogram for malignant neoplasm of breast: Secondary | ICD-10-CM

## 2018-02-02 ENCOUNTER — Encounter: Payer: Self-pay | Admitting: Nurse Practitioner

## 2018-02-02 NOTE — Progress Notes (Signed)
Abstracted result and sent to scan  

## 2018-02-28 ENCOUNTER — Ambulatory Visit
Admission: RE | Admit: 2018-02-28 | Discharge: 2018-02-28 | Disposition: A | Discharge status: Home / Self Care | Payer: Federal, State, Local not specified - PPO | Source: Ambulatory Visit | Attending: Nurse Practitioner | Admitting: Nurse Practitioner

## 2018-02-28 DIAGNOSIS — Z1231 Encounter for screening mammogram for malignant neoplasm of breast: Secondary | ICD-10-CM

## 2018-06-22 ENCOUNTER — Ambulatory Visit: Payer: Federal, State, Local not specified - PPO | Admitting: Nurse Practitioner

## 2018-06-22 ENCOUNTER — Encounter: Payer: Self-pay | Admitting: Nurse Practitioner

## 2018-06-22 VITALS — BP 136/86 | HR 77 | Temp 98.2°F | Ht 68.0 in | Wt 280.0 lb

## 2018-06-22 DIAGNOSIS — J4521 Mild intermittent asthma with (acute) exacerbation: Secondary | ICD-10-CM

## 2018-06-22 DIAGNOSIS — E782 Mixed hyperlipidemia: Secondary | ICD-10-CM

## 2018-06-22 DIAGNOSIS — E039 Hypothyroidism, unspecified: Secondary | ICD-10-CM

## 2018-06-22 DIAGNOSIS — I10 Essential (primary) hypertension: Secondary | ICD-10-CM | POA: Diagnosis not present

## 2018-06-22 DIAGNOSIS — E118 Type 2 diabetes mellitus with unspecified complications: Secondary | ICD-10-CM

## 2018-06-22 LAB — TSH: TSH: 3.12 m[IU]/L

## 2018-06-22 LAB — T4, FREE: FREE T4: 1 ng/dL (ref 0.8–1.8)

## 2018-06-22 LAB — HEMOGLOBIN A1C: HEMOGLOBIN A1C: 6.5 % (ref 4.6–6.5)

## 2018-06-22 MED ORDER — AMLODIPINE BESYLATE 5 MG PO TABS: 5.0000 mg | ORAL_TABLET | Freq: Every day | ORAL | 1 refills | Status: DC

## 2018-06-22 MED ORDER — ATORVASTATIN CALCIUM 20 MG PO TABS: 20.0000 mg | ORAL_TABLET | Freq: Every day | ORAL | 1 refills | Status: DC

## 2018-06-22 MED ORDER — ALBUTEROL SULFATE HFA 108 (90 BASE) MCG/ACT IN AERS: 1.0000 | INHALATION_SPRAY | Freq: Four times a day (QID) | RESPIRATORY_TRACT | 2 refills | Status: DC | PRN

## 2018-06-22 NOTE — Addendum Note (Signed)
Addended by: Varney BilesWIESNER, Adekunle Rohrbach M on: 06/22/2018 03:30 PM   Modules accepted: Orders

## 2018-06-22 NOTE — Patient Instructions (Addendum)
Stable thyroid function and DM hgbA1c of 6.5 Maintain current medications. ASCVD 3680yrs risk: 8.90%. Recommend use of atorvastatin to lower risk <7%. F/up in 6months (fasting).

## 2018-06-22 NOTE — Progress Notes (Signed)
Subjective:  Patient ID: Emily Zavala, female    DOB: [birth year]  Age: 52 y.o. MRN: [id number]  CC: Follow-up (6 MO FU/potassium capsult consult/refills. )  HPI  Obesity: Exercise: 3x/week, zumba Diet: small portions and low fat and low carb. Lost 16lbs in last 44month Wt Readings from Last 3 Encounters:  06/22/18 280 lb (127 kg)  12/15/17 296 lb (134.3 kg)  08/28/17 294 lb (133.4 kg)   HTN: Controlled with amlodipine, lisinopril, and HCTZ,  BP Readings from Last 3 Encounters:  06/22/18 136/86  12/15/17 138/76  08/28/17 140/84   DM: Controlled with glimepiride. Last hgbA1c of 6.5. No paresthesia. Has made changes to diet and maintaining regular exercise. Declined recommended immunizations. Up to date with eye exam.  ASCVD 143yrrisk: 8.90% No known CAD No FHx of premature CAD. Consider use of statin? Lipid Panel     Component Value Date/Time   CHOL 182 12/15/2017 1124   TRIG 89.0 12/15/2017 1124   HDL 56.70 12/15/2017 1124   CHOLHDL 3 12/15/2017 1124   VLDL 17.8 12/15/2017 1124   LDLCALC 108 (H) 12/15/2017 1124   Hypothyroidism: Dose changed 56m78monthgo. Did not return to lab for repeat TSH and t4. Will repeat today  PAP: will schedule with GYN.  Reviewed past Medical, Social and Family history today.  Outpatient Medications Prior to Visit  Medication Sig Dispense Refill  . ACCU-CHEK AVIVA PLUS test strip USE TO TEST BLOOD SUGAR ONCE D BEFORE BREAKFAST  1  . acetaminophen (TYLENOL) 500 MG tablet Take 1 tablet (500 mg total) by mouth every 6 (six) hours as needed. 30 tablet 0  . Lancets Misc. (UNISTIK 3) MISC 1 Units by Does not apply route 2 (two) times daily before a meal. Low flow, I.8mm68m8G 100 each 11  . phenazopyridine (PYRIDIUM) 100 MG tablet Take 1 tablet (100 mg total) by mouth 3 (three) times daily as needed for pain (with food). 10 tablet 0  . albuterol (PROVENTIL HFA;VENTOLIN HFA) 108 (90 Base) MCG/ACT inhaler Inhale 1 puff into the lungs  every 6 (six) hours as needed for wheezing or shortness of breath. 1 Inhaler 2  . amLODipine (NORVASC) 5 MG tablet Take 1 tablet (5 mg total) by mouth daily. 90 tablet 1  . atorvastatin (LIPITOR) 20 MG tablet Take 1 tablet (20 mg total) by mouth daily. 90 tablet 1  . glimepiride (AMARYL) 4 MG tablet TAKE 1 TABLET(4 MG) BY MOUTH DAILY BEFORE BREAKFAST 90 tablet 1  . hydrochlorothiazide (HYDRODIURIL) 25 MG tablet Take 1 tablet (25 mg total) by mouth daily. 90 tablet 1  . levothyroxine (SYNTHROID, LEVOTHROID) 150 MCG tablet TAKE 1 TABLET(175 MCG) BY MOUTH DAILY BEFORE BREAKFAST 90 tablet 1  . lisinopril (PRINIVIL,ZESTRIL) 20 MG tablet Take 1 tablet (20 mg total) by mouth 2 (two) times daily. 180 tablet 1  . potassium chloride SA (K-DUR,KLOR-CON) 20 MEQ tablet Take 1 tablet (20 mEq total) by mouth daily. 90 tablet 1  . blood glucose meter kit and supplies KIT Dispense based on patient and insurance preference. Use once a day (before breakfast) as directed. (Patient not taking: Reported on 06/22/2018) 1 each 1   No facility-administered medications prior to visit.     ROS See HPI  Objective:  BP 136/86   Pulse 77   Temp 98.2 F (36.8 C) (Oral)   Ht _0  (1.727 m)   Wt 280 lb (127 kg)   SpO2 98%   BMI 42.57 kg/m   BP Readings  from Last 3 Encounters:  06/22/18 136/86  12/15/17 138/76  08/28/17 140/84    Wt Readings from Last 3 Encounters:  06/22/18 280 lb (127 kg)  12/15/17 296 lb (134.3 kg)  08/28/17 294 lb (133.4 kg)    Physical Exam  Constitutional: She is oriented to person, place, and time. She appears well-developed and well-nourished.  HENT:  Right Ear: External ear normal.  Left Ear: External ear normal.  Mouth/Throat: Oropharynx is clear and moist.  Neck: Normal range of motion. Neck supple. No thyromegaly present.  Cardiovascular: Normal rate, regular rhythm, normal heart sounds and intact distal pulses.  Pulmonary/Chest: Effort normal and breath sounds normal.    Musculoskeletal: She exhibits no edema.  Neurological: She is alert and oriented to person, place, and time.  Psychiatric: She has a normal mood and affect. Her behavior is normal. Thought content normal.  Vitals reviewed.   Lab Results  Component Value Date   WBC 5.0 12/03/2016   HGB 13.6 12/03/2016   HCT 42.7 12/03/2016   PLT 272 12/03/2016   GLUCOSE 96 12/15/2017   CHOL 182 12/15/2017   TRIG 89.0 12/15/2017   HDL 56.70 12/15/2017   LDLCALC 108 (H) 12/15/2017   ALT 25 12/15/2017   AST 20 12/15/2017   NA 139 12/15/2017   K 3.6 12/15/2017   CL 99 12/15/2017   CREATININE 0.78 12/15/2017   BUN 18 12/15/2017   CO2 34 (H) 12/15/2017   TSH 3.12 06/22/2018   HGBA1C 6.5 06/22/2018   MICROALBUR 1.6 12/15/2017    Mm 3d Screen Breast Bilateral  Result Date: 02/28/2018 CLINICAL DATA:  Screening. EXAM: DIGITAL SCREENING BILATERAL MAMMOGRAM WITH TOMO AND CAD COMPARISON:  Previous exam(s). ACR Breast Density Category b: There are scattered areas of fibroglandular density. FINDINGS: There are no findings suspicious for malignancy. Images were processed with CAD. IMPRESSION: No mammographic evidence of malignancy. A result letter of this screening mammogram will be mailed directly to the patient. RECOMMENDATION: Screening mammogram in one year. (Code:SM-B-01Y) BI-RADS CATEGORY  1: Negative. Electronically Signed   By: Lajean Manes M.D.   On: 02/28/2018 12:45    Assessment & Plan:   Nairi was seen today for follow-up.  Diagnoses and all orders for this visit:  Type 2 diabetes mellitus with complication, without long-term current use of insulin (HCC) -     Hemoglobin A1c -     glimepiride (AMARYL) 4 MG tablet; TAKE 1 TABLET(4 MG) BY MOUTH DAILY BEFORE BREAKFAST  Essential hypertension, benign -     amLODipine (NORVASC) 5 MG tablet; Take 1 tablet (5 mg total) by mouth daily. -     hydrochlorothiazide (HYDRODIURIL) 25 MG tablet; Take 1 tablet (25 mg total) by mouth daily. -      lisinopril (PRINIVIL,ZESTRIL) 20 MG tablet; Take 1 tablet (20 mg total) by mouth 2 (two) times daily. -     Potassium Chloride ER 20 MEQ TBCR; Take 1 capsule by mouth daily.  Mixed hyperlipidemia -     atorvastatin (LIPITOR) 20 MG tablet; Take 1 tablet (20 mg total) by mouth daily.  Mild intermittent asthma with acute exacerbation -     albuterol (PROVENTIL HFA;VENTOLIN HFA) 108 (90 Base) MCG/ACT inhaler; Inhale 1 puff into the lungs every 6 (six) hours as needed for wheezing or shortness of breath.  Hypothyroidism, unspecified type -     TSH -     T4, free -     levothyroxine (SYNTHROID, LEVOTHROID) 150 MCG tablet; TAKE 1 TABLET(175 MCG) BY  MOUTH DAILY BEFORE BREAKFAST   I have discontinued Avalin N. Rames "[employer]"'s potassium chloride SA. I am also having her start on Potassium Chloride ER. Additionally, I am having her maintain her blood glucose meter kit and supplies, ACCU-CHEK AVIVA PLUS, UNISTIK 3, acetaminophen, phenazopyridine, albuterol, amLODipine, atorvastatin, glimepiride, hydrochlorothiazide, levothyroxine, and lisinopril.  Meds ordered this encounter  Medications  . albuterol (PROVENTIL HFA;VENTOLIN HFA) 108 (90 Base) MCG/ACT inhaler    Sig: Inhale 1 puff into the lungs every 6 (six) hours as needed for wheezing or shortness of breath.    Dispense:  1 Inhaler    Refill:  2    Order Specific Question:   Supervising Provider    Answer:   MATTHEWS, CODY [[id number]]  . amLODipine (NORVASC) 5 MG tablet    Sig: Take 1 tablet (5 mg total) by mouth daily.    Dispense:  90 tablet    Refill:  1    **Patient requests 90 days supply**    Order Specific Question:   Supervising Provider    Answer:   MATTHEWS, CODY [[id number]]  . atorvastatin (LIPITOR) 20 MG tablet    Sig: Take 1 tablet (20 mg total) by mouth daily.    Dispense:  90 tablet    Refill:  1    **Patient requests 90 days supply**    Order Specific Question:   Supervising Provider    Answer:   MATTHEWS, CODY  [[id number]]  . glimepiride (AMARYL) 4 MG tablet    Sig: TAKE 1 TABLET(4 MG) BY MOUTH DAILY BEFORE BREAKFAST    Dispense:  90 tablet    Refill:  3    Order Specific Question:   Supervising Provider    Answer:   Lucille Passy [[id number]]  . hydrochlorothiazide (HYDRODIURIL) 25 MG tablet    Sig: Take 1 tablet (25 mg total) by mouth daily.    Dispense:  90 tablet    Refill:  1    Order Specific Question:   Supervising Provider    Answer:   Lucille Passy [[id number]]  . levothyroxine (SYNTHROID, LEVOTHROID) 150 MCG tablet    Sig: TAKE 1 TABLET(175 MCG) BY MOUTH DAILY BEFORE BREAKFAST    Dispense:  90 tablet    Refill:  1    Ok to give generic.    Order Specific Question:   Supervising Provider    Answer:   Lucille Passy [[id number]]  . lisinopril (PRINIVIL,ZESTRIL) 20 MG tablet    Sig: Take 1 tablet (20 mg total) by mouth 2 (two) times daily.    Dispense:  180 tablet    Refill:  3    Order Specific Question:   Supervising Provider    Answer:   Lucille Passy [[id number]]  . Potassium Chloride ER 20 MEQ TBCR    Sig: Take 1 capsule by mouth daily.    Dispense:  90 tablet    Refill:  1    Order Specific Question:   Supervising Provider    Answer:   Lucille Passy [[id number]]    Follow-up: Return in about 1 year (around 06/23/2019) for DM and HTN, hyperlipidemia.  Wilfred Lacy, NP

## 2018-06-22 NOTE — Addendum Note (Signed)
Addended by: Marios Gaiser M on: 06/22/2018 03:30 PM   Modules accepted: Orders  

## 2018-06-25 ENCOUNTER — Encounter: Payer: Self-pay | Admitting: Nurse Practitioner

## 2018-06-25 ENCOUNTER — Telehealth: Payer: Self-pay | Admitting: Nurse Practitioner

## 2018-06-25 DIAGNOSIS — I10 Essential (primary) hypertension: Secondary | ICD-10-CM

## 2018-06-25 MED ORDER — POTASSIUM CHLORIDE ER 10 MEQ PO CPCR: 20.0000 meq | ORAL_CAPSULE | Freq: Every day | ORAL | 1 refills | Status: DC

## 2018-06-25 MED ORDER — LISINOPRIL 20 MG PO TABS: 20.0000 mg | ORAL_TABLET | Freq: Two times a day (BID) | ORAL | 3 refills | Status: DC

## 2018-06-25 MED ORDER — HYDROCHLOROTHIAZIDE 25 MG PO TABS: 25.0000 mg | ORAL_TABLET | Freq: Every day | ORAL | 1 refills | Status: DC

## 2018-06-25 MED ORDER — GLIMEPIRIDE 4 MG PO TABS: ORAL_TABLET | ORAL | 3 refills | Status: DC

## 2018-06-25 MED ORDER — POTASSIUM CHLORIDE ER 20 MEQ PO TBCR: 1.0000 | EXTENDED_RELEASE_TABLET | Freq: Every day | ORAL | 1 refills | Status: DC

## 2018-06-25 MED ORDER — LEVOTHYROXINE SODIUM 150 MCG PO TABS: ORAL_TABLET | ORAL | 1 refills | Status: DC

## 2018-06-25 NOTE — Assessment & Plan Note (Signed)
ASCVD 6487yrs risk: 8.90% No known CAD No FHx of premature CAD. Consider use of atorvastatin.  Lipid Panel     Component Value Date/Time   CHOL 182 12/15/2017 1124   TRIG 89.0 12/15/2017 1124   HDL 56.70 12/15/2017 1124   CHOLHDL 3 12/15/2017 1124   VLDL 17.8 12/15/2017 1124   LDLCALC 108 (H) 12/15/2017 1124

## 2018-06-25 NOTE — Telephone Encounter (Signed)
error 

## 2018-08-07 ENCOUNTER — Encounter: Payer: Self-pay | Admitting: Obstetrics and Gynecology

## 2018-08-07 ENCOUNTER — Ambulatory Visit: Payer: Medicaid Other | Admitting: Obstetrics and Gynecology

## 2018-08-07 VITALS — BP 152/86 | HR 84 | Ht 68.0 in | Wt 283.0 lb

## 2018-08-07 DIAGNOSIS — L900 Lichen sclerosus et atrophicus: Secondary | ICD-10-CM | POA: Diagnosis not present

## 2018-08-07 DIAGNOSIS — L292 Pruritus vulvae: Secondary | ICD-10-CM | POA: Diagnosis not present

## 2018-08-07 DIAGNOSIS — N930 Postcoital and contact bleeding: Secondary | ICD-10-CM

## 2018-08-07 MED ORDER — CLOBETASOL PROPIONATE 0.05 % EX CREA: 1.0000 "application " | TOPICAL_CREAM | Freq: Two times a day (BID) | CUTANEOUS | 1 refills | Status: AC

## 2018-08-07 NOTE — Progress Notes (Signed)
HPI:      [name] is a 52 y.o. G0P0000 who LMP was No LMP recorded. Patient has had a hysterectomy.  Subjective:   She presents today for a pelvic exam and to "establish care".  She states that she will only go to a man for pelvic exam because "I love men and I would only let men down there". She reports a rash on her breast she would like examined She complains of some vulvar itching with a "rough spot". She reports occasional bleeding after intercourse but says "it could be him not me". Of significant note, patient has had a previous hysterectomy.    Hx: The following portions of the patient's history were reviewed and updated as appropriate:             She  has a past medical history of Allergy, Anemia, Arthritis, Asthma, Chronic pain of right ankle (08/22/2016), Diabetes mellitus without complication ([place]), Hyperlipidemia, Hypertension, and Thyroid disease. She does not have any pertinent problems on file. She  has a past surgical history that includes Foot surgery (Right, 10/18/2012); Tonsillectomy; Myomectomy abdominal approach; and Abdominal hysterectomy. Her family history includes Breast cancer in her maternal grandmother and mother; CAD in her mother; Cancer in her father, maternal grandmother, and mother; Dementia in her mother; Diabetes in her father and mother; Hypertension in her mother; Mental illness in her father; Stroke in her maternal grandmother. She  reports that she has never smoked. She has never used smokeless tobacco. She reports that she does not drink alcohol or use drugs. She has a current medication list which includes the following prescription(s): accu-chek aviva plus, albuterol, amlodipine, atorvastatin, blood glucose meter kit and supplies, glimepiride, hydrochlorothiazide, unistik 3, levothyroxine, lisinopril, potassium chloride, and clobetasol cream. She is allergic to levaquin [levofloxacin in d5w] and penicillins.       Review of Systems:   Review of Systems  Constitutional: Denied constitutional symptoms, night sweats, recent illness, fatigue, fever, insomnia and weight loss.  Eyes: Denied eye symptoms, eye pain, photophobia, vision change and visual disturbance.  Ears/Nose/Throat/Neck: Denied ear, nose, throat or neck symptoms, hearing loss, nasal discharge, sinus congestion and sore throat.  Cardiovascular: Denied cardiovascular symptoms, arrhythmia, chest pain/pressure, edema, exercise intolerance, orthopnea and palpitations.  Respiratory: Denied pulmonary symptoms, asthma, pleuritic pain, productive sputum, cough, dyspnea and wheezing.  Gastrointestinal: Denied, gastro-esophageal reflux, melena, nausea and vomiting.  Genitourinary: See HPI for additional information.  Musculoskeletal: Denied musculoskeletal symptoms, stiffness, swelling, muscle weakness and myalgia.  Dermatologic: Denied dermatology symptoms, rash and scar.  Neurologic: Denied neurology symptoms, dizziness, headache, neck pain and syncope.  Psychiatric: Denied psychiatric symptoms, anxiety and depression.  Endocrine: Denied endocrine symptoms including hot flashes and night sweats.   Meds:   Current Outpatient Medications on File Prior to Visit  Medication Sig Dispense Refill  . ACCU-CHEK AVIVA PLUS test strip USE TO TEST BLOOD SUGAR ONCE D BEFORE BREAKFAST  1  . albuterol (PROVENTIL HFA;VENTOLIN HFA) 108 (90 Base) MCG/ACT inhaler Inhale 1 puff into the lungs every 6 (six) hours as needed for wheezing or shortness of breath. 1 Inhaler 2  . amLODipine (NORVASC) 5 MG tablet Take 1 tablet (5 mg total) by mouth daily. 90 tablet 1  . atorvastatin (LIPITOR) 20 MG tablet Take 1 tablet (20 mg total) by mouth daily. 90 tablet 1  . blood glucose meter kit and supplies KIT Dispense based on patient and insurance preference. Use once a day (before breakfast) as directed. 1 each 1  .  glimepiride (AMARYL) 4 MG tablet TAKE 1 TABLET(4 MG) BY MOUTH DAILY BEFORE BREAKFAST  90 tablet 3  . hydrochlorothiazide (HYDRODIURIL) 25 MG tablet Take 1 tablet (25 mg total) by mouth daily. 90 tablet 1  . Lancets Misc. (UNISTIK 3) MISC 1 Units by Does not apply route 2 (two) times daily before a meal. Low flow, I.2m, 28G 100 each 11  . levothyroxine (SYNTHROID, LEVOTHROID) 150 MCG tablet TAKE 1 TABLET(175 MCG) BY MOUTH DAILY BEFORE BREAKFAST 90 tablet 1  . lisinopril (PRINIVIL,ZESTRIL) 20 MG tablet Take 1 tablet (20 mg total) by mouth 2 (two) times daily. 180 tablet 3  . potassium chloride (MICRO-K) 10 MEQ CR capsule Take 2 capsules (20 mEq total) by mouth daily. 180 capsule 1   No current facility-administered medications on file prior to visit.     Objective:     Vitals:   08/07/18 1015  BP: (!) 152/86  Pulse: 84              Breast exam reveals no evidence of rash.  The area of concern seems entirely normal with no underlying thickening or mass.  There are no dominant masses in the breast and no axillary adenopathy.  (Left breast)  Physical examination   Pelvic:   Vulva: Normal appearance.  No lesions.  Significant acetowhite change of both left and right labia thickening and irregularity noted on the left.  Not condylomatous.  Vagina: No lesions or abnormalities noted.  No atrophy noted.  No areas of concern for bleeding noted.  Support: Normal pelvic support.  Urethra No masses tenderness or scarring.  Meatus Normal size without lesions or prolapse.  Cervix:  Surgically absent  Anus: Normal exam.  No lesions.  Perineum: Normal exam.  No lesions.        Bimanual   Uterus:  Surgically absent  Adnexae: No masses.  Non-tender to palpation.  Cul-de-sac: Negative for abnormality.     Assessment:    G0P0000 Patient Active Problem List   Diagnosis Date Noted  . Chronic pain of right ankle 08/22/2016  . Diabetes mellitus ([place] 06/24/2016  . Essential hypertension, benign 06/23/2016  . Hypothyroidism 06/23/2016  . Hyperlipidemia 06/23/2016  . Asthma with  acute exacerbation 06/23/2016     1. Postcoital bleeding   2. Vulvar itching   3. Lichen sclerosus     I can find no source of her postcoital bleeding.  It is likely she has lichen sclerosus causing the vaginal itching and white changes.  However the thickening and irregularity is somewhat concerning.   Plan:            1.  Expectant management of resolved breast mass and postcoital bleeding.  2.  Clobetasol for 6 weeks to affected area as directed.  If no significant improvement strongly consider biopsy of irregular firm area.  I have discussed the significance and the necessity of this in detail with the patient. Orders No orders of the defined types were placed in this encounter.    Meds ordered this encounter  Medications  . clobetasol cream (TEMOVATE) 0.05 %    Sig: Apply 1 application topically 2 (two) times daily. Use for 30 days twice a day as directed then use daily as directed.    Dispense:  60 g    Refill:  1      F/U  Return in about 3 weeks (around 08/28/2018). I spent 33 minutes involved in the care of this patient of which greater than 50% was  spent discussing her concerns regarding breast rash, postcoital bleeding, vulvar itching and irregularity.  Differential diagnosis of each discussed in detail.  Work-up discussed in detail.  Use of medication for vulvar itching and possible necessity of biopsy discussed.  All questions answered.  Finis Bud, M.D. 08/07/2018 11:51 AM

## 2018-08-07 NOTE — Progress Notes (Signed)
Pt presents today to establish care. Pt would like to have an exam including a pap smear. Pt also complains of bleeding during intercourse. Pt is also concerned with a rash on her left breast.

## 2018-08-10 ENCOUNTER — Encounter: Payer: Self-pay | Admitting: Nurse Practitioner

## 2018-08-13 ENCOUNTER — Telehealth: Payer: Self-pay | Admitting: Nurse Practitioner

## 2018-08-13 NOTE — Telephone Encounter (Signed)
Telephone call with [name]: Me: How did your his sleep study go? Emily Zavala: He stated report will be about 1-2weeks Me: OK, once a get that I will let you know about the next step. I called today to talk to you and Mrs. Neuner about maintaining a respectful and non threatening communication to help me and my support staff to provide safe care to the best of my medical expertise. That also includes allowing time for us to address your requests. I do understand that you may not agree with all my medical recommendation or protocols, but these are put in place so the best care is provided for patients. In the absence of a respectful and non threatening patient-provider relationship, it will be best for you to find a more suitable provider for your care. Emily Zavala: I would have thought a conversation like this will take place in person. I thought you were only calling to check how the sleep study went. I take my role as caregiver very seriously, so I will not take "No" for an answer till I exhaust my options. I think some medical professionals want to play GOD. I am a caregiver and I take my role seriously. I don't know what you do on a daily basis, but you are not in my shoes.You are not just providing a service to us. We are providing a service to you as well. Without patients, you will not have a job. You may not be privileged to this information, but I have developed a rapport with Amy Bland. She told me to reach out to her if I have any concerns. It sounds like you a reprimanding me for talking to patient experience. I do not think I was wrong. You talk about support staff, you are you referring to because I do not have any problem with anyone in that office except nurse-Tonya. I do not deal with her anymore and I made that clear to Ronnie. Why do they feel offended. They should not feel offended if they are doing their job. Me: Does Emily Zavala have anything to add? Emily Zavala: No he does  not. He is upset and does not want to talk. That is why I take care of most things. He tries not to get upset due to previous stroke. Me: This phone call is not about reprimanding you for talking to patient experience. You have every right to use your resource.This call is to re-establish a foundation of our patient-provider relationship in relation to your care. This is about you giving us the time to address your medical requests. This is also about not using disrespectful or threatening language towards the support staff when you do not agree with my medical recommendation or protocol. We need to communicate to find alternative if you do not agree with certain recommendations. This is why my medical assistant had reached out to discuss an alternative to getting sleep study completed. I give you certain medical recommendation and use certain medical protocols based on my medical expertise. If we can not reach an agreement, then will be for you to find another provider. Emily Zavala: You have mentioned several times the need to find another provider. Are calling to ask our permission to remove yourself from our care? If that is the case, let me know in plain language, for I do not have a problem with you. I am not going to apologize for what I did. When I ask for something to be done.    I expect it to be done.You should have informed me that your authority in limited. When I saw you in office and asked for capsules, but tablets were sent in. I brought a bottle in office to show exactly what I was asking for. How much clearly can I communicate to you what I need. I feel that was negligent of you part. I do not have that kind of money to be driving to pharmacy for something I do not need. I came into your office to request for CPAP machine, why were we not told a sleep study was needed? He had a sleep study 2years ago, why is another sleep study needed? Emily Zavala: I talked to the company in [place] and was  told my sleep study was good and they gave me my numbers. All I needed was a chip and a cord. The company in [place] says I have to pay for it out of pocket. [name]: He feels like you are not taking this serious and is sad. He skipped church today because of this. Me: I did understand your request about the need to switch from tablets to capsule. I did send in capsules as requested, but electronic transmission was incorrectly completely. When you called, I resent the medication again as capsule. Your initial request was in regards to CPAP supplies on 07/13/2018. An order for supplies was entered 07/16/2018. There was no mention about CPAP machine. The need for CPAP machine was mentioned 07/30/2018. This is when I advised about a need for repeat sleep study through Pete. Pete made me understand that Emily Zavala did not want to do sleep study and you were upset. I had a telephone conversation with both of you on 07/30/2018 and explained the need for sleep study in order to replace CPAP machine. I also informed you then that your previous records indicate that previous sleep study was incomplete and needs to be repeated.You agreed to proceeding with referral to [facility] for sleep study consultation. Emily Zavala: We did mention to you 06/22/2018 about need for cpap machine and also called the call center. Me: I do not recall any conversation  About CPAP on that day, not did i document that it was discussed during your last OV.  Emily Zavala: Is this the normal protocol to have such conversation over the phone?. I will like to talk you your superior. Me: I do not conversation with patient's over the phone and/or in person. I will document our conversation and I will have the office manager call you back. Emily Zavala: I do not need to talk to the manager. I am ok. This is my normal way of doing things. I will expect you and others in the office would know this. Thank you for your time. Me: Thank you for your time as  well. 

## 2018-08-28 ENCOUNTER — Encounter: Payer: Medicaid Other | Admitting: Obstetrics and Gynecology

## 2018-12-21 ENCOUNTER — Encounter: Payer: Self-pay | Admitting: Nurse Practitioner

## 2018-12-21 ENCOUNTER — Ambulatory Visit: Payer: Federal, State, Local not specified - PPO | Admitting: Nurse Practitioner

## 2018-12-21 ENCOUNTER — Other Ambulatory Visit: Payer: Self-pay

## 2018-12-21 ENCOUNTER — Ambulatory Visit (INDEPENDENT_AMBULATORY_CARE_PROVIDER_SITE_OTHER): Payer: Federal, State, Local not specified - PPO

## 2018-12-21 VITALS — BP 140/76 | HR 87 | Temp 98.7°F | Ht 68.0 in | Wt 291.2 lb

## 2018-12-21 DIAGNOSIS — E782 Mixed hyperlipidemia: Secondary | ICD-10-CM | POA: Diagnosis not present

## 2018-12-21 DIAGNOSIS — E039 Hypothyroidism, unspecified: Secondary | ICD-10-CM

## 2018-12-21 DIAGNOSIS — J4541 Moderate persistent asthma with (acute) exacerbation: Secondary | ICD-10-CM | POA: Diagnosis not present

## 2018-12-21 DIAGNOSIS — R0602 Shortness of breath: Secondary | ICD-10-CM

## 2018-12-21 DIAGNOSIS — I10 Essential (primary) hypertension: Secondary | ICD-10-CM | POA: Diagnosis not present

## 2018-12-21 DIAGNOSIS — E118 Type 2 diabetes mellitus with unspecified complications: Secondary | ICD-10-CM

## 2018-12-21 LAB — HEPATIC FUNCTION PANEL
ALT: 17 U/L (ref 0–35)
AST: 17 U/L (ref 0–37)
Albumin: 4.4 g/dL (ref 3.5–5.2)
Alkaline Phosphatase: 72 U/L (ref 39–117)
Bilirubin, Direct: 0.1 mg/dL (ref 0.0–0.3)
TOTAL PROTEIN: 7.2 g/dL (ref 6.0–8.3)
Total Bilirubin: 0.6 mg/dL (ref 0.2–1.2)

## 2018-12-21 LAB — TSH: TSH: 3.26 u[IU]/mL (ref 0.35–4.50)

## 2018-12-21 LAB — BASIC METABOLIC PANEL
BUN: 14 mg/dL (ref 6–23)
CO2: 30 meq/L (ref 19–32)
Calcium: 9.7 mg/dL (ref 8.4–10.5)
Chloride: 100 mEq/L (ref 96–112)
Creatinine, Ser: 0.8 mg/dL (ref 0.40–1.20)
GFR: 91.16 mL/min (ref >60.00)
GLUCOSE: 95 mg/dL (ref 70–99)
POTASSIUM: 3.8 meq/L (ref 3.5–5.1)
SODIUM: 141 meq/L (ref 135–145)

## 2018-12-21 LAB — HEMOGLOBIN A1C: Hgb A1c MFr Bld: 6.5 % (ref 4.6–6.5)

## 2018-12-21 LAB — LIPID PANEL
CHOLESTEROL: 196 mg/dL (ref 0–200)
HDL: 58.1 mg/dL (ref >39.00)
LDL Cholesterol: 117 mg/dL — ABNORMAL HIGH (ref 0–99)
NonHDL: 138.24
TRIGLYCERIDES: 105 mg/dL (ref 0.0–149.0)
Total CHOL/HDL Ratio: 3
VLDL: 21 mg/dL (ref 0.0–40.0)

## 2018-12-21 LAB — T4, FREE: Free T4: 0.71 ng/dL (ref 0.60–1.60)

## 2018-12-21 LAB — MICROALBUMIN / CREATININE URINE RATIO
Creatinine,U: 71.2 mg/dL
Microalb Creat Ratio: 1 mg/g (ref 0.0–30.0)
Microalb, Ur: <0.7 mg/dL (ref 0.0–1.9)

## 2018-12-21 MED ORDER — BUDESONIDE-FORMOTEROL FUMARATE 80-4.5 MCG/ACT IN AERO: 2.0000 | INHALATION_SPRAY | Freq: Two times a day (BID) | RESPIRATORY_TRACT | 1 refills | Status: DC

## 2018-12-21 NOTE — Progress Notes (Signed)
Subjective:  Patient ID: Emily Zavala, female    DOB: [birth year]  Age: 53 y.o. MRN: [id number]  CC: Follow-up (6 mo fu/DM and HTN, hyperlipidemia/fasting/) and Cough (couging yellow/green mucus/use inhaler/going on since 11/29/2018/--)  Cough  This is a new problem. The current episode started 1 to 4 weeks ago. The problem has been unchanged. The cough is productive of purulent sputum. Associated symptoms include myalgias, nasal congestion, postnasal drip, shortness of breath, sweats and wheezing. Pertinent negatives include no chest pain, chills, fever, headaches, heartburn, rhinorrhea or sore throat. The symptoms are aggravated by cold air. She has tried a beta-agonist inhaler for the symptoms. The treatment provided mild relief. Her past medical history is significant for asthma.   HTN: Stable with lisinopril and HCTZ. BP Readings from Last 3 Encounters:  12/21/18 140/76  08/07/18 (!) 152/86  06/22/18 136/86   DM: Controlled with amaryl. Last HgbA1c 6.5 Normal urine microalbumin.  Reviewed past Medical, Social and Family history today.  Outpatient Medications Prior to Visit  Medication Sig Dispense Refill  . ACCU-CHEK AVIVA PLUS test strip USE TO TEST BLOOD SUGAR ONCE D BEFORE BREAKFAST  1  . albuterol (PROVENTIL HFA;VENTOLIN HFA) 108 (90 Base) MCG/ACT inhaler Inhale 1 puff into the lungs every 6 (six) hours as needed for wheezing or shortness of breath. 1 Inhaler 2  . Lancets Misc. (UNISTIK 3) MISC 1 Units by Does not apply route 2 (two) times daily before a meal. Low flow, I.78m, 28G 100 each 11  . amLODipine (NORVASC) 5 MG tablet Take 1 tablet (5 mg total) by mouth daily. 90 tablet 1  . atorvastatin (LIPITOR) 20 MG tablet Take 1 tablet (20 mg total) by mouth daily. 90 tablet 1  . glimepiride (AMARYL) 4 MG tablet TAKE 1 TABLET(4 MG) BY MOUTH DAILY BEFORE BREAKFAST 90 tablet 3  . hydrochlorothiazide (HYDRODIURIL) 25 MG tablet Take 1 tablet (25 mg total) by mouth daily. 90  tablet 1  . levothyroxine (SYNTHROID, LEVOTHROID) 150 MCG tablet TAKE 1 TABLET(175 MCG) BY MOUTH DAILY BEFORE BREAKFAST 90 tablet 1  . lisinopril (PRINIVIL,ZESTRIL) 20 MG tablet Take 1 tablet (20 mg total) by mouth 2 (two) times daily. 180 tablet 3  . potassium chloride (MICRO-K) 10 MEQ CR capsule Take 2 capsules (20 mEq total) by mouth daily. 180 capsule 1  . blood glucose meter kit and supplies KIT Dispense based on patient and insurance preference. Use once a day (before breakfast) as directed. (Patient not taking: Reported on 12/21/2018) 1 each 1   No facility-administered medications prior to visit.     ROS See HPI  Objective:  BP 140/76   Pulse 87   Temp 98.7 F (37.1 C) (Oral)   Ht _0  (1.727 m)   Wt 291 lb 3.2 oz (132.1 kg)   SpO2 95%   BMI 44.28 kg/m   BP Readings from Last 3 Encounters:  12/21/18 140/76  08/07/18 (!) 152/86  06/22/18 136/86    Wt Readings from Last 3 Encounters:  12/21/18 291 lb 3.2 oz (132.1 kg)  08/07/18 283 lb (128.4 kg)  06/22/18 280 lb (127 kg)    Physical Exam Constitutional:      General: She is not in acute distress.    Appearance: She is obese. She is not diaphoretic.  HENT:     Right Ear: Tympanic membrane and ear canal normal.     Left Ear: Tympanic membrane and ear canal normal.  Cardiovascular:     Rate and Rhythm: Normal  rate and regular rhythm.     Pulses: Normal pulses.     Heart sounds: Normal heart sounds.  Pulmonary:     Effort: Pulmonary effort is normal. No respiratory distress.     Breath sounds: No wheezing.     Comments: Difficulty auscultating lung sounds due to body habitus. Musculoskeletal:     Right lower leg: No edema.     Left lower leg: No edema.  Neurological:     Mental Status: She is alert and oriented to person, place, and time.  Psychiatric:        Mood and Affect: Mood normal.        Behavior: Behavior normal.        Thought Content: Thought content normal.     Lab Results  Component Value  Date   WBC 5.0 12/03/2016   HGB 13.6 12/03/2016   HCT 42.7 12/03/2016   PLT 272 12/03/2016   GLUCOSE 95 12/21/2018   CHOL 196 12/21/2018   TRIG 105.0 12/21/2018   HDL 58.10 12/21/2018   LDLCALC 117 (H) 12/21/2018   ALT 17 12/21/2018   AST 17 12/21/2018   NA 141 12/21/2018   K 3.8 12/21/2018   CL 100 12/21/2018   CREATININE 0.80 12/21/2018   BUN 14 12/21/2018   CO2 30 12/21/2018   TSH 3.26 12/21/2018   HGBA1C 6.5 12/21/2018   MICROALBUR <0.7 12/21/2018    Mm 3d Screen Breast Bilateral  Result Date: 02/28/2018 CLINICAL DATA:  Screening. EXAM: DIGITAL SCREENING BILATERAL MAMMOGRAM WITH TOMO AND CAD COMPARISON:  Previous exam(s). ACR Breast Density Category b: There are scattered areas of fibroglandular density. FINDINGS: There are no findings suspicious for malignancy. Images were processed with CAD. IMPRESSION: No mammographic evidence of malignancy. A result letter of this screening mammogram will be mailed directly to the patient. RECOMMENDATION: Screening mammogram in one year. (Code:SM-B-01Y) BI-RADS CATEGORY  1: Negative. Electronically Signed   By: Lajean Manes M.D.   On: 02/28/2018 12:45    Assessment & Plan:   Emily Zavala was seen today for follow-up and cough.  Diagnoses and all orders for this visit:  Essential hypertension, benign -     Basic metabolic panel -     amLODipine (NORVASC) 5 MG tablet; Take 1 tablet (5 mg total) by mouth daily. -     hydrochlorothiazide (HYDRODIURIL) 25 MG tablet; Take 1 tablet (25 mg total) by mouth daily. -     lisinopril (PRINIVIL,ZESTRIL) 20 MG tablet; Take 1 tablet (20 mg total) by mouth 2 (two) times daily. -     potassium chloride (MICRO-K) 10 MEQ CR capsule; Take 2 capsules (20 mEq total) by mouth daily.  Type 2 diabetes mellitus with complication, without long-term current use of insulin (HCC) -     Hemoglobin A1c -     Microalbumin / creatinine urine ratio -     Basic metabolic panel -     glimepiride (AMARYL) 4 MG tablet; TAKE  1 TABLET(4 MG) BY MOUTH DAILY BEFORE BREAKFAST  Mixed hyperlipidemia -     Lipid panel -     Hepatic function panel -     atorvastatin (LIPITOR) 20 MG tablet; Take 1 tablet (20 mg total) by mouth daily.  Hypothyroidism, unspecified type -     TSH -     T4, free -     levothyroxine (SYNTHROID, LEVOTHROID) 150 MCG tablet; TAKE 1 TABLET(175 MCG) BY MOUTH DAILY BEFORE BREAKFAST  Moderate persistent asthma with acute exacerbation -  DG Chest 2 View -     budesonide-formoterol (SYMBICORT) 80-4.5 MCG/ACT inhaler; Inhale 2 puffs into the lungs 2 (two) times daily. Rinse mouth after each use  SOB (shortness of breath) on exertion -     DG Chest 2 View -     budesonide-formoterol (SYMBICORT) 80-4.5 MCG/ACT inhaler; Inhale 2 puffs into the lungs 2 (two) times daily. Rinse mouth after each use   I am having Emily Zavala "[employer] Losey" start on budesonide-formoterol. I am also having her maintain her blood glucose meter kit and supplies, Accu-Chek Aviva Plus, Unistik 3, albuterol, amLODipine, atorvastatin, glimepiride, hydrochlorothiazide, levothyroxine, lisinopril, and potassium chloride.  Meds ordered this encounter  Medications  . budesonide-formoterol (SYMBICORT) 80-4.5 MCG/ACT inhaler    Sig: Inhale 2 puffs into the lungs 2 (two) times daily. Rinse mouth after each use    Dispense:  1 Inhaler    Refill:  1    Order Specific Question:   Supervising Provider    Answer:   MATTHEWS, CODY [[id number]]  . amLODipine (NORVASC) 5 MG tablet    Sig: Take 1 tablet (5 mg total) by mouth daily.    Dispense:  90 tablet    Refill:  3    Order Specific Question:   Supervising Provider    Answer:   Lucille Passy [[id number]]  . atorvastatin (LIPITOR) 20 MG tablet    Sig: Take 1 tablet (20 mg total) by mouth daily.    Dispense:  90 tablet    Refill:  3    Order Specific Question:   Supervising Provider    Answer:   Lucille Passy [[id number]]  . glimepiride (AMARYL) 4 MG tablet    Sig: TAKE 1 TABLET(4  MG) BY MOUTH DAILY BEFORE BREAKFAST    Dispense:  90 tablet    Refill:  3    Order Specific Question:   Supervising Provider    Answer:   Lucille Passy [[id number]]  . hydrochlorothiazide (HYDRODIURIL) 25 MG tablet    Sig: Take 1 tablet (25 mg total) by mouth daily.    Dispense:  90 tablet    Refill:  1    Order Specific Question:   Supervising Provider    Answer:   Lucille Passy [[id number]]  . levothyroxine (SYNTHROID, LEVOTHROID) 150 MCG tablet    Sig: TAKE 1 TABLET(175 MCG) BY MOUTH DAILY BEFORE BREAKFAST    Dispense:  90 tablet    Refill:  1    Order Specific Question:   Supervising Provider    Answer:   Lucille Passy [[id number]]  . lisinopril (PRINIVIL,ZESTRIL) 20 MG tablet    Sig: Take 1 tablet (20 mg total) by mouth 2 (two) times daily.    Dispense:  180 tablet    Refill:  3    Order Specific Question:   Supervising Provider    Answer:   Lucille Passy [[id number]]  . potassium chloride (MICRO-K) 10 MEQ CR capsule    Sig: Take 2 capsules (20 mEq total) by mouth daily.    Dispense:  180 capsule    Refill:  1    Order Specific Question:   Supervising Provider    Answer:   Lucille Passy [[id number]]    Problem List Items Addressed This Visit      Cardiovascular and Mediastinum   Essential hypertension, benign - Primary   Relevant Medications   amLODipine (NORVASC) 5 MG tablet   atorvastatin (LIPITOR) 20 MG tablet   hydrochlorothiazide (  HYDRODIURIL) 25 MG tablet   lisinopril (PRINIVIL,ZESTRIL) 20 MG tablet   potassium chloride (MICRO-K) 10 MEQ CR capsule   Other Relevant Orders   Basic metabolic panel (Completed)     Respiratory   Asthma with acute exacerbation   Relevant Medications   budesonide-formoterol (SYMBICORT) 80-4.5 MCG/ACT inhaler   Other Relevant Orders   DG Chest 2 View (Completed)     Endocrine   Hypothyroidism   Relevant Medications   levothyroxine (SYNTHROID, LEVOTHROID) 150 MCG tablet   Other Relevant Orders   TSH (Completed)   T4, free (Completed)     Other    Hyperlipidemia   Relevant Medications   amLODipine (NORVASC) 5 MG tablet   atorvastatin (LIPITOR) 20 MG tablet   hydrochlorothiazide (HYDRODIURIL) 25 MG tablet   lisinopril (PRINIVIL,ZESTRIL) 20 MG tablet   Other Relevant Orders   Lipid panel (Completed)   Hepatic function panel (Completed)    Other Visit Diagnoses    Type 2 diabetes mellitus with complication, without long-term current use of insulin (HCC)       Relevant Medications   atorvastatin (LIPITOR) 20 MG tablet   glimepiride (AMARYL) 4 MG tablet   lisinopril (PRINIVIL,ZESTRIL) 20 MG tablet   Other Relevant Orders   Hemoglobin A1c (Completed)   Microalbumin / creatinine urine ratio (Completed)   Basic metabolic panel (Completed)   SOB (shortness of breath) on exertion       Relevant Medications   budesonide-formoterol (SYMBICORT) 80-4.5 MCG/ACT inhaler   Other Relevant Orders   DG Chest 2 View (Completed)       Follow-up: Return in about 6 months (around 06/23/2019) for DM and HTN, hyperlipidemia (25mns, needs PAP?).  CWilfred Lacy NP

## 2018-12-21 NOTE — Patient Instructions (Addendum)
Controlled DM with normal urine microalbumin and hgbA1c of 6.5. Stable renal, liver and thyroid function. Abnormal lipid panel due to LDL of 117. Maintain DASH diet and regular exercise. F/up in 32months  Normal CXR. Will proceed with inhaled corticosteroid as discussed. No oral abx needed at this time.

## 2018-12-24 ENCOUNTER — Encounter: Payer: Self-pay | Admitting: Nurse Practitioner

## 2018-12-24 MED ORDER — AMLODIPINE BESYLATE 5 MG PO TABS: 5.0000 mg | ORAL_TABLET | Freq: Every day | ORAL | 3 refills | Status: DC

## 2018-12-24 MED ORDER — POTASSIUM CHLORIDE ER 10 MEQ PO CPCR: 20.0000 meq | ORAL_CAPSULE | Freq: Every day | ORAL | 1 refills | Status: DC

## 2018-12-24 MED ORDER — ATORVASTATIN CALCIUM 20 MG PO TABS: 20.0000 mg | ORAL_TABLET | Freq: Every day | ORAL | 3 refills | Status: DC

## 2018-12-24 MED ORDER — LEVOTHYROXINE SODIUM 150 MCG PO TABS: ORAL_TABLET | ORAL | 1 refills | Status: DC

## 2018-12-24 MED ORDER — GLIMEPIRIDE 4 MG PO TABS: ORAL_TABLET | ORAL | 3 refills | Status: DC

## 2018-12-24 MED ORDER — LISINOPRIL 20 MG PO TABS: 20.0000 mg | ORAL_TABLET | Freq: Two times a day (BID) | ORAL | 3 refills | Status: DC

## 2018-12-24 MED ORDER — HYDROCHLOROTHIAZIDE 25 MG PO TABS: 25.0000 mg | ORAL_TABLET | Freq: Every day | ORAL | 1 refills | Status: DC

## 2019-03-08 ENCOUNTER — Encounter: Payer: Self-pay | Admitting: Podiatry

## 2019-03-08 ENCOUNTER — Ambulatory Visit: Payer: Federal, State, Local not specified - PPO | Admitting: Podiatry

## 2019-03-08 ENCOUNTER — Ambulatory Visit (INDEPENDENT_AMBULATORY_CARE_PROVIDER_SITE_OTHER): Payer: Federal, State, Local not specified - PPO

## 2019-03-08 ENCOUNTER — Other Ambulatory Visit: Payer: Self-pay

## 2019-03-08 VITALS — Temp 98.4°F

## 2019-03-08 DIAGNOSIS — E119 Type 2 diabetes mellitus without complications: Secondary | ICD-10-CM | POA: Diagnosis not present

## 2019-03-08 DIAGNOSIS — Q828 Other specified congenital malformations of skin: Secondary | ICD-10-CM | POA: Diagnosis not present

## 2019-03-08 DIAGNOSIS — L600 Ingrowing nail: Secondary | ICD-10-CM | POA: Diagnosis not present

## 2019-03-08 DIAGNOSIS — M216X9 Other acquired deformities of unspecified foot: Secondary | ICD-10-CM | POA: Diagnosis not present

## 2019-03-08 DIAGNOSIS — M79675 Pain in left toe(s): Secondary | ICD-10-CM

## 2019-03-08 DIAGNOSIS — B351 Tinea unguium: Secondary | ICD-10-CM | POA: Diagnosis not present

## 2019-03-08 DIAGNOSIS — M779 Enthesopathy, unspecified: Secondary | ICD-10-CM | POA: Diagnosis not present

## 2019-03-08 DIAGNOSIS — M79674 Pain in right toe(s): Secondary | ICD-10-CM | POA: Diagnosis not present

## 2019-03-08 NOTE — Progress Notes (Signed)
Subjective:   Patient ID: Emily Zavala, female   DOB: 53 y.o.   MRN: [id number]   HPI 53 year old female presents the office today for concerns of calluses, painful ball of her foot as well as for possible ingrown toenails.  She does have a history of having a partial nail avulsion previously.  She states is not been able to get a pedicure.  Her nails become painful towards the tip of the nails.  She denies any redness or drainage or any swelling to the toenail or the callus sites.  No recent injury to her feet.  She does have remote history of an ankle fracture.  She states that she is very active and on her feet quite a bit.   Review of Systems  All other systems reviewed and are negative.  Past Medical History:  Diagnosis Date  . Allergy    yr around- sporatic episodes   . Anemia    with fibroids   . Arthritis    ankle   . Asthma    stable without any medication.  . Chronic pain of right ankle 08/22/2016  . Diabetes mellitus without complication ([place])   . Hyperlipidemia   . Hypertension   . Thyroid disease     Past Surgical History:  Procedure Laterality Date  . ABDOMINAL HYSTERECTOMY     uterus and cervix removed, still has ovaries, 01/2010  . FOOT SURGERY Right 10/18/2012   plate and screws   . MYOMECTOMY ABDOMINAL APPROACH     10/1998  . TONSILLECTOMY     1983     Current Outpatient Medications:  .  ACCU-CHEK AVIVA PLUS test strip, USE TO TEST BLOOD SUGAR ONCE D BEFORE BREAKFAST, Disp: , Rfl: 1 .  albuterol (PROVENTIL HFA;VENTOLIN HFA) 108 (90 Base) MCG/ACT inhaler, Inhale 1 puff into the lungs every 6 (six) hours as needed for wheezing or shortness of breath., Disp: 1 Inhaler, Rfl: 2 .  amLODipine (NORVASC) 5 MG tablet, Take 1 tablet (5 mg total) by mouth daily., Disp: 90 tablet, Rfl: 3 .  atorvastatin (LIPITOR) 20 MG tablet, Take 1 tablet (20 mg total) by mouth daily., Disp: 90 tablet, Rfl: 3 .  blood glucose meter kit and supplies KIT, Dispense based on  patient and insurance preference. Use once a day (before breakfast) as directed. (Patient not taking: Reported on 12/21/2018), Disp: 1 each, Rfl: 1 .  budesonide-formoterol (SYMBICORT) 80-4.5 MCG/ACT inhaler, Inhale 2 puffs into the lungs 2 (two) times daily. Rinse mouth after each use, Disp: 1 Inhaler, Rfl: 1 .  glimepiride (AMARYL) 4 MG tablet, TAKE 1 TABLET(4 MG) BY MOUTH DAILY BEFORE BREAKFAST, Disp: 90 tablet, Rfl: 3 .  hydrochlorothiazide (HYDRODIURIL) 25 MG tablet, Take 1 tablet (25 mg total) by mouth daily., Disp: 90 tablet, Rfl: 1 .  Lancets Misc. (UNISTIK 3) MISC, 1 Units by Does not apply route 2 (two) times daily before a meal. Low flow, I.26m, 28G, Disp: 100 each, Rfl: 11 .  levothyroxine (SYNTHROID, LEVOTHROID) 150 MCG tablet, TAKE 1 TABLET(175 MCG) BY MOUTH DAILY BEFORE BREAKFAST, Disp: 90 tablet, Rfl: 1 .  lisinopril (PRINIVIL,ZESTRIL) 20 MG tablet, Take 1 tablet (20 mg total) by mouth 2 (two) times daily., Disp: 180 tablet, Rfl: 3 .  potassium chloride (MICRO-K) 10 MEQ CR capsule, Take 2 capsules (20 mEq total) by mouth daily., Disp: 180 capsule, Rfl: 1 .  Potassium Chloride ER 20 MEQ TBCR, TK 1 T PO D, Disp: , Rfl:   Allergies  Allergen Reactions  .  Levaquin [Levofloxacin In D5w] Hives    Upset stomach   . Penicillins Hives          Objective:  Physical Exam  General: AAO x3, NAD  Dermatological: Nails are very elongated particularly hallux toenails and they are mildly incurvated toward the distal aspect on both medial lateral corners of the hallux.  The remainder the nails are also hypertrophic, dystrophic, discolored.  Tenderness nails 1-5 bilaterally.  Mild hyperkeratotic tissue bilateral submetatarsal 2.  No ongoing ulceration drainage or signs of infection.  No open lesions otherwise.  Vascular: Dorsalis Pedis artery and Posterior Tibial artery pedal pulses are 2/4 bilateral with immedate capillary fill time.  There is no pain with calf compression, swelling, warmth,  erythema.  Chronic ankle edema bilaterally.  Neruologic: Grossly intact via light touch bilateral.   Musculoskeletal: No area pinpoint tenderness.  Muscular strength 5/5 in all groups tested bilateral.  Gait: Unassisted, Nonantalgic.      Assessment:   Symptomatic onychomycosis, ingrown toenails; hyperkeratotic lesions     Plan:  -Treatment options discussed including all alternatives, risks, and complications -Etiology of symptoms were discussed -X-rays were obtained and reviewed with the patient.  Previous ankle fracture on the right side with hardware intact.  No evidence of acute fracture or stress fracture.  Mild hallux varus is present bilaterally.  Flatfoot is present. -Hyperkeratotic lesions debrided x2 without any complications or bleeding.  Discussed offloading, moisturizer. -Nails debrided x10 without any complications or bleeding.  Discussed treatment options for nail fungus.  She is instructed natural treatments including tea tree oil.  Discussed other options as well.  Return if symptoms worsen or fail to improve.  Trula Slade DPM

## 2019-04-17 ENCOUNTER — Other Ambulatory Visit: Payer: Self-pay | Admitting: Nurse Practitioner

## 2019-04-17 DIAGNOSIS — Z1231 Encounter for screening mammogram for malignant neoplasm of breast: Secondary | ICD-10-CM

## 2019-05-09 ENCOUNTER — Ambulatory Visit
Admission: RE | Admit: 2019-05-09 | Discharge: 2019-05-09 | Disposition: A | Discharge status: Home / Self Care | Payer: Federal, State, Local not specified - PPO | Source: Ambulatory Visit | Attending: Nurse Practitioner | Admitting: Nurse Practitioner

## 2019-05-09 ENCOUNTER — Other Ambulatory Visit: Payer: Self-pay

## 2019-05-09 DIAGNOSIS — Z1231 Encounter for screening mammogram for malignant neoplasm of breast: Secondary | ICD-10-CM

## 2019-05-10 ENCOUNTER — Ambulatory Visit: Payer: Federal, State, Local not specified - PPO

## 2019-06-27 ENCOUNTER — Telehealth: Payer: Self-pay | Admitting: Nurse Practitioner

## 2019-06-27 NOTE — Telephone Encounter (Signed)

## 2019-06-28 ENCOUNTER — Other Ambulatory Visit: Payer: Self-pay

## 2019-06-28 ENCOUNTER — Ambulatory Visit: Payer: Federal, State, Local not specified - PPO | Admitting: Nurse Practitioner

## 2019-06-28 ENCOUNTER — Encounter: Payer: Self-pay | Admitting: Nurse Practitioner

## 2019-06-28 VITALS — BP 142/90 | HR 84 | Temp 98.7°F | Ht 68.0 in | Wt 311.4 lb

## 2019-06-28 DIAGNOSIS — I10 Essential (primary) hypertension: Secondary | ICD-10-CM

## 2019-06-28 DIAGNOSIS — E039 Hypothyroidism, unspecified: Secondary | ICD-10-CM | POA: Diagnosis not present

## 2019-06-28 DIAGNOSIS — E782 Mixed hyperlipidemia: Secondary | ICD-10-CM | POA: Diagnosis not present

## 2019-06-28 DIAGNOSIS — E1165 Type 2 diabetes mellitus with hyperglycemia: Secondary | ICD-10-CM | POA: Diagnosis not present

## 2019-06-28 DIAGNOSIS — R8290 Unspecified abnormal findings in urine: Secondary | ICD-10-CM

## 2019-06-28 DIAGNOSIS — E118 Type 2 diabetes mellitus with unspecified complications: Secondary | ICD-10-CM

## 2019-06-28 LAB — LIPID PANEL
Cholesterol: 186 mg/dL (ref 0–200)
HDL: 55.8 mg/dL (ref >39.00)
LDL Cholesterol: 109 mg/dL — ABNORMAL HIGH (ref 0–99)
NonHDL: 130.64
Total CHOL/HDL Ratio: 3
Triglycerides: 106 mg/dL (ref 0.0–149.0)
VLDL: 21.2 mg/dL (ref 0.0–40.0)

## 2019-06-28 LAB — POCT URINALYSIS DIPSTICK
Bilirubin, UA: NEGATIVE
Glucose, UA: NEGATIVE
Ketones, UA: 5
Leukocytes, UA: NEGATIVE
Nitrite, UA: NEGATIVE
Protein, UA: NEGATIVE
Spec Grav, UA: 1.01 (ref 1.010–1.025)
Urobilinogen, UA: 0.2 E.U./dL
pH, UA: 7 (ref 5.0–8.0)

## 2019-06-28 LAB — BASIC METABOLIC PANEL
BUN: 9 mg/dL (ref 6–23)
CO2: 30 mEq/L (ref 19–32)
Calcium: 10.1 mg/dL (ref 8.4–10.5)
Chloride: 97 mEq/L (ref 96–112)
Creatinine, Ser: 0.8 mg/dL (ref 0.40–1.20)
GFR: 90.98 mL/min (ref >60.00)
Glucose, Bld: 115 mg/dL — ABNORMAL HIGH (ref 70–99)
Potassium: 3.5 mEq/L (ref 3.5–5.1)
Sodium: 139 mEq/L (ref 135–145)

## 2019-06-28 LAB — TSH: TSH: 2.77 u[IU]/mL (ref 0.35–4.50)

## 2019-06-28 LAB — T4, FREE: Free T4: 0.61 ng/dL (ref 0.60–1.60)

## 2019-06-28 LAB — HEMOGLOBIN A1C: Hgb A1c MFr Bld: 7 % — ABNORMAL HIGH (ref 4.6–6.5)

## 2019-06-28 MED ORDER — NITROFURANTOIN MONOHYD MACRO 100 MG PO CAPS: 100.0000 mg | ORAL_CAPSULE | Freq: Two times a day (BID) | ORAL | 0 refills | Status: AC

## 2019-06-28 NOTE — Patient Instructions (Addendum)
Sign medical release to get previous PAP results  Stable thyroid function: maintain current dose of levothyroxine. Stable lipid panel, but you have a 14.8% risk of cardiovascular disease over the next 21yrs. This is due to your diagnosis of diabetes and hypertension. I increased lipitor to 40mg  Stable renal function and electrolyte with elevated glucose. This reliefs and increase in hgbA1c 6.5 to 7.0. I decreased glimepiride to 2mg  with supper and added metformin 500mg  with breakfast. F/up in 62months instead of 2months.  It is important to start daily exercise (moderate intensity) and low fat diet.

## 2019-06-28 NOTE — Progress Notes (Signed)
Subjective:  Patient ID: Emily Zavala, female    DOB: 18-Feb-1966  Age: 53 y.o. MRN: [id number]  CC: Follow-up (6 mo follow up--back pain--had frequent urinate/going on 2 days--)  Back Pain This is a new problem. The current episode started in the past 7 days. The problem occurs constantly. The problem is unchanged. The pain is present in the lumbar spine. The quality of the pain is described as aching. The pain does not radiate. The pain is the same all the time. Pertinent negatives include no abdominal pain, bladder incontinence, bowel incontinence, chest pain, dysuria, fever, headaches, leg pain, numbness, paresis, paresthesias, pelvic pain, perianal numbness, tingling, weakness or weight loss. Risk factors include obesity, menopause, lack of exercise, poor posture and sedentary lifestyle. She has tried nothing for the symptoms.   HTN: stable Not compliant with DASH dieta  DM: does not check glucose at home regularly Last hgbA1c of 6.5 Weight gain noted, admits to not following low fat/low carb diet.   Reviewed past Medical, Social and Family history today.  Outpatient Medications Prior to Visit  Medication Sig Dispense Refill  . ACCU-CHEK AVIVA PLUS test strip USE TO TEST BLOOD SUGAR ONCE D BEFORE BREAKFAST  1  . albuterol (PROVENTIL HFA;VENTOLIN HFA) 108 (90 Base) MCG/ACT inhaler Inhale 1 puff into the lungs every 6 (six) hours as needed for wheezing or shortness of breath. 1 Inhaler 2  . amLODipine (NORVASC) 5 MG tablet Take 1 tablet (5 mg total) by mouth daily. 90 tablet 3  . budesonide-formoterol (SYMBICORT) 80-4.5 MCG/ACT inhaler Inhale 2 puffs into the lungs 2 (two) times daily. Rinse mouth after each use 1 Inhaler 1  . Lancets Misc. (UNISTIK 3) MISC 1 Units by Does not apply route 2 (two) times daily before a meal. Low flow, I.97m, 28G 100 each 11  . lisinopril (PRINIVIL,ZESTRIL) 20 MG tablet Take 1 tablet (20 mg total) by mouth 2 (two) times daily. 180 tablet 3  . Omega-3  Fatty Acids (FISH OIL PO) Take by mouth.    .Marland KitchenVITAMIN D PO Take by mouth.    .Marland KitchenVITAMIN E PO Take by mouth.    .Marland Kitchenatorvastatin (LIPITOR) 20 MG tablet Take 1 tablet (20 mg total) by mouth daily. 90 tablet 3  . glimepiride (AMARYL) 4 MG tablet TAKE 1 TABLET(4 MG) BY MOUTH DAILY BEFORE BREAKFAST 90 tablet 3  . hydrochlorothiazide (HYDRODIURIL) 25 MG tablet Take 1 tablet (25 mg total) by mouth daily. 90 tablet 1  . levothyroxine (SYNTHROID, LEVOTHROID) 150 MCG tablet TAKE 1 TABLET(175 MCG) BY MOUTH DAILY BEFORE BREAKFAST 90 tablet 1  . potassium chloride (MICRO-K) 10 MEQ CR capsule Take 2 capsules (20 mEq total) by mouth daily. 180 capsule 1  . blood glucose meter kit and supplies KIT Dispense based on patient and insurance preference. Use once a day (before breakfast) as directed. (Patient not taking: Reported on 12/21/2018) 1 each 1  . Potassium Chloride ER 20 MEQ TBCR TK 1 T PO D     No facility-administered medications prior to visit.     ROS See HPI  Objective:  BP (!) 142/90   Pulse 84   Temp 98.7 F (37.1 C) (Tympanic)   Ht 5' 8"  (1.727 m)   Wt (!) 311 lb 6.4 oz (141.3 kg)   SpO2 95%   BMI 47.35 kg/m   BP Readings from Last 3 Encounters:  06/28/19 (!) 142/90  12/21/18 140/76  08/07/18 (!) 152/86   Wt Readings from Last 3  Encounters:  06/28/19 (!) 311 lb 6.4 oz (141.3 kg)  12/21/18 291 lb 3.2 oz (132.1 kg)  08/07/18 283 lb (128.4 kg)   Physical Exam Constitutional:      Appearance: She is obese.  Cardiovascular:     Rate and Rhythm: Normal rate and regular rhythm.     Pulses: Normal pulses.     Heart sounds: Normal heart sounds.  Pulmonary:     Effort: Pulmonary effort is normal.     Breath sounds: Normal breath sounds.  Musculoskeletal:     Right lower leg: Edema present.     Left lower leg: Edema present.  Neurological:     Mental Status: She is alert and oriented to person, place, and time.    Lab Results  Component Value Date   WBC 5.0 12/03/2016   HGB  13.6 12/03/2016   HCT 42.7 12/03/2016   PLT 272 12/03/2016   GLUCOSE 115 (H) 06/28/2019   CHOL 186 06/28/2019   TRIG 106.0 06/28/2019   HDL 55.80 06/28/2019   LDLCALC 109 (H) 06/28/2019   ALT 17 12/21/2018   AST 17 12/21/2018   NA 139 06/28/2019   K 3.5 06/28/2019   CL 97 06/28/2019   CREATININE 0.80 06/28/2019   BUN 9 06/28/2019   CO2 30 06/28/2019   TSH 2.77 06/28/2019   HGBA1C 7.0 (H) 06/28/2019   MICROALBUR <0.7 12/21/2018   Mm 3d Screen Breast Bilateral  Result Date: 05/09/2019 CLINICAL DATA:  Screening. EXAM: DIGITAL SCREENING BILATERAL MAMMOGRAM WITH TOMO AND CAD COMPARISON:  Previous exam(s). ACR Breast Density Category b: There are scattered areas of fibroglandular density. FINDINGS: There are no findings suspicious for malignancy. Images were processed with CAD. IMPRESSION: No mammographic evidence of malignancy. A result letter of this screening mammogram will be mailed directly to the patient. RECOMMENDATION: Screening mammogram in one year. (Code:SM-B-01Y) BI-RADS CATEGORY  1: Negative. Electronically Signed   By: Lajean Manes M.D.   On: 05/09/2019 15:49    Assessment & Plan:   [name] was seen today for follow-up.  Diagnoses and all orders for this visit:  Essential hypertension, benign -     Basic metabolic panel -     potassium chloride (MICRO-K) 10 MEQ CR capsule; Take 2 capsules (20 mEq total) by mouth daily. -     hydrochlorothiazide (HYDRODIURIL) 25 MG tablet; Take 1 tablet (25 mg total) by mouth daily.  Hypothyroidism, unspecified type -     T4, free -     TSH -     levothyroxine (SYNTHROID) 150 MCG tablet; TAKE 1 TABLET(175 MCG) BY MOUTH DAILY BEFORE BREAKFAST  Type 2 diabetes mellitus with hyperglycemia, without long-term current use of insulin (HCC) -     Hemoglobin A1c -     Basic metabolic panel  Mixed hyperlipidemia -     Lipid panel -     atorvastatin (LIPITOR) 40 MG tablet; Take 1 tablet (40 mg total) by mouth daily at 6 PM.  Abnormal  urinalysis -     POCT urinalysis dipstick -     Urine Culture -     nitrofurantoin, macrocrystal-monohydrate, (MACROBID) 100 MG capsule; Take 1 capsule (100 mg total) by mouth 2 (two) times daily for 3 days.  Type 2 diabetes mellitus with complication, without long-term current use of insulin (HCC) -     glimepiride (AMARYL) 2 MG tablet; Take 1 tablet (2 mg total) by mouth daily with supper. -     metFORMIN (GLUMETZA) 500 MG (MOD) 24  hr tablet; Take 1 tablet (500 mg total) by mouth daily with breakfast.   I have discontinued Emily Zavala "[employer]"'s Potassium Chloride ER. I have also changed her atorvastatin, glimepiride, and levothyroxine. Additionally, I am having her start on nitrofurantoin (macrocrystal-monohydrate) and metFORMIN. Lastly, I am having her maintain her blood glucose meter kit and supplies, Accu-Chek Aviva Plus, [place] 3, albuterol, budesonide-formoterol, amLODipine, lisinopril, Omega-3 Fatty Acids (FISH OIL PO), VITAMIN D PO, VITAMIN E PO, potassium chloride, and hydrochlorothiazide.  Meds ordered this encounter  Medications  . nitrofurantoin, macrocrystal-monohydrate, (MACROBID) 100 MG capsule    Sig: Take 1 capsule (100 mg total) by mouth 2 (two) times daily for 3 days.    Dispense:  6 capsule    Refill:  0    Order Specific Question:   Supervising Provider    Answer:   MATTHEWS, CODY [[id number]]  . atorvastatin (LIPITOR) 40 MG tablet    Sig: Take 1 tablet (40 mg total) by mouth daily at 6 PM.    Dispense:  90 tablet    Refill:  1    Change in dose    Order Specific Question:   Supervising Provider    Answer:   MATTHEWS, CODY [[id number]]  . glimepiride (AMARYL) 2 MG tablet    Sig: Take 1 tablet (2 mg total) by mouth daily with supper.    Dispense:  90 tablet    Refill:  1    Change in dose    Order Specific Question:   Supervising Provider    Answer:   MATTHEWS, CODY [[id number]]  . levothyroxine (SYNTHROID) 150 MCG tablet    Sig: TAKE 1 TABLET(175 MCG) BY  MOUTH DAILY BEFORE BREAKFAST    Dispense:  90 tablet    Refill:  1    Order Specific Question:   Supervising Provider    Answer:   MATTHEWS, CODY [[id number]]  . potassium chloride (MICRO-K) 10 MEQ CR capsule    Sig: Take 2 capsules (20 mEq total) by mouth daily.    Dispense:  180 capsule    Refill:  1    Order Specific Question:   Supervising Provider    Answer:   MATTHEWS, CODY [[id number]]  . metFORMIN (GLUMETZA) 500 MG (MOD) 24 hr tablet    Sig: Take 1 tablet (500 mg total) by mouth daily with breakfast.    Dispense:  90 tablet    Refill:  1    Order Specific Question:   Supervising Provider    Answer:   MATTHEWS, CODY [[id number]]  . hydrochlorothiazide (HYDRODIURIL) 25 MG tablet    Sig: Take 1 tablet (25 mg total) by mouth daily.    Dispense:  90 tablet    Refill:  1    Order Specific Question:   Supervising Provider    Answer:   MATTHEWS, CODY [[id number]]    Problem List Items Addressed This Visit      Cardiovascular and Mediastinum   Essential hypertension, benign - Primary    Stable BP with amlodipine, HCTZ and lisinopril.  Advised about importance of DASH diet and regular exercise. BP Readings from Last 3 Encounters:  06/28/19 (!) 142/90  12/21/18 140/76  08/07/18 (!) 152/86        Relevant Medications   atorvastatin (LIPITOR) 40 MG tablet   potassium chloride (MICRO-K) 10 MEQ CR capsule   hydrochlorothiazide (HYDRODIURIL) 25 MG tablet   Other Relevant Orders   Basic metabolic panel (Completed)     Endocrine   Diabetes  mellitus ([place])    Worsening glucose control hgbA1c of 6.5 to 7.0 Add metformin 574m in Am and glimepiride 255min PM Normal urine microalbumin. Pending eye exam? Normal foot exam. She not compliant with diet and no exercise regimen. She declined referral to nutritionist. Advised about the importance of diet compliance, daily exercise and weight loss. F/up in 67m7month    Relevant Medications   atorvastatin (LIPITOR) 40 MG tablet   glimepiride (AMARYL) 2  MG tablet   metFORMIN (GLUMETZA) 500 MG (MOD) 24 hr tablet   Other Relevant Orders   Hemoglobin A1c (Completed)   Basic metabolic panel (Completed)   Hypothyroidism    Stable TSH and T4 Continue levothyroxine 150m25m    Relevant Medications   levothyroxine (SYNTHROID) 150 MCG tablet   Other Relevant Orders   T4, free (Completed)   TSH (Completed)     Other   Hyperlipidemia    75yr8yrVD risk of 14.8% Increase lipitor dose to 40mg 367m Relevant Medications   atorvastatin (LIPITOR) 40 MG tablet   hydrochlorothiazide (HYDRODIURIL) 25 MG tablet   Other Relevant Orders   Lipid panel (Completed)    Other Visit Diagnoses    Abnormal urinalysis       Relevant Medications   nitrofurantoin, macrocrystal-monohydrate, (MACROBID) 100 MG capsule   Other Relevant Orders   POCT urinalysis dipstick (Completed)   Urine Culture   Type 2 diabetes mellitus with complication, without long-term current use of insulin (HCC)       Relevant Medications   atorvastatin (LIPITOR) 40 MG tablet   glimepiride (AMARYL) 2 MG tablet   metFORMIN (GLUMETZA) 500 MG (MOD) 24 hr tablet      Follow-up: Return in about 3 months (around 09/27/2019) for DM and HTN, hyperlipidemia (30mins34mCharlotWilfred Lacy

## 2019-06-29 ENCOUNTER — Encounter: Payer: Self-pay | Admitting: Nurse Practitioner

## 2019-06-29 LAB — URINE CULTURE
MICRO NUMBER:: 923391
SPECIMEN QUALITY:: ADEQUATE

## 2019-06-29 MED ORDER — GLIMEPIRIDE 2 MG PO TABS: 2.0000 mg | ORAL_TABLET | Freq: Every day | ORAL | 1 refills | Status: DC

## 2019-06-29 MED ORDER — ATORVASTATIN CALCIUM 40 MG PO TABS: 40.0000 mg | ORAL_TABLET | Freq: Every day | ORAL | 1 refills | Status: DC

## 2019-06-29 MED ORDER — METFORMIN HCL ER (MOD) 500 MG PO TB24: 500.0000 mg | ORAL_TABLET | Freq: Every day | ORAL | 1 refills | Status: DC

## 2019-06-29 MED ORDER — HYDROCHLOROTHIAZIDE 25 MG PO TABS: 25.0000 mg | ORAL_TABLET | Freq: Every day | ORAL | 1 refills | Status: DC

## 2019-06-29 MED ORDER — POTASSIUM CHLORIDE ER 10 MEQ PO CPCR: 20.0000 meq | ORAL_CAPSULE | Freq: Every day | ORAL | 1 refills | Status: DC

## 2019-06-29 MED ORDER — LEVOTHYROXINE SODIUM 150 MCG PO TABS: ORAL_TABLET | ORAL | 1 refills | Status: DC

## 2019-06-29 NOTE — Assessment & Plan Note (Signed)
37yrs ASCVD risk of 14.8% Increase lipitor dose to 40mg 

## 2019-06-29 NOTE — Assessment & Plan Note (Signed)
Stable BP with amlodipine, HCTZ and lisinopril.  Advised about importance of DASH diet and regular exercise. BP Readings from Last 3 Encounters:  06/28/19 (!) 142/90  12/21/18 140/76  08/07/18 (!) 152/86

## 2019-06-29 NOTE — Assessment & Plan Note (Addendum)
Worsening glucose control hgbA1c of 6.5 to 7.0 Add metformin 500mg  in Am and glimepiride 2mg  in PM Normal urine microalbumin. Pending eye exam? Normal foot exam. She not compliant with diet and no exercise regimen. She declined referral to nutritionist. Advised about the importance of diet compliance, daily exercise and weight loss. F/up in 56months

## 2019-06-29 NOTE — Assessment & Plan Note (Signed)
Stable TSH and T4 Continue levothyroxine 135mcg

## 2019-06-30 ENCOUNTER — Encounter: Payer: Self-pay | Admitting: Nurse Practitioner

## 2019-06-30 DIAGNOSIS — E1165 Type 2 diabetes mellitus with hyperglycemia: Secondary | ICD-10-CM

## 2019-07-01 MED ORDER — SITAGLIPTIN PHOSPHATE 50 MG PO TABS: 50.0000 mg | ORAL_TABLET | Freq: Every day | ORAL | 5 refills | Status: DC

## 2019-07-02 ENCOUNTER — Encounter: Payer: Self-pay | Admitting: Nurse Practitioner

## 2019-07-02 DIAGNOSIS — E1165 Type 2 diabetes mellitus with hyperglycemia: Secondary | ICD-10-CM

## 2019-07-02 MED ORDER — DAPAGLIFLOZIN PROPANEDIOL 5 MG PO TABS: 5.0000 mg | ORAL_TABLET | Freq: Every day | ORAL | 5 refills | Status: DC

## 2019-07-05 ENCOUNTER — Encounter: Payer: Self-pay | Admitting: Nurse Practitioner

## 2019-07-05 DIAGNOSIS — Z90710 Acquired absence of both cervix and uterus: Secondary | ICD-10-CM | POA: Insufficient documentation

## 2019-07-09 ENCOUNTER — Telehealth: Payer: Self-pay | Admitting: Nurse Practitioner

## 2019-07-09 NOTE — Telephone Encounter (Signed)
PA started for Farxiga 5 mg, waiting for response.   Emily Zavala (Key: [id number])

## 2019-07-10 NOTE — Telephone Encounter (Signed)
Pharmacy is aware. PA approved.  

## 2019-09-20 ENCOUNTER — Telehealth: Payer: Self-pay

## 2019-09-20 NOTE — Telephone Encounter (Signed)
Pt requesting pap results from 08/07/18. No pap sent to pathology at visit with Dr. Amalia Hailey. Pt s/p hysterectomy absence of cervix.

## 2019-10-20 ENCOUNTER — Encounter: Payer: Self-pay | Admitting: Nurse Practitioner

## 2019-10-21 ENCOUNTER — Telehealth: Payer: Self-pay | Admitting: Nurse Practitioner

## 2019-10-21 NOTE — Telephone Encounter (Signed)
Talked with patient at length about her and husband's care.  She advised to remove [facility] as their PCP.

## 2019-10-30 ENCOUNTER — Other Ambulatory Visit: Payer: Self-pay | Admitting: Nurse Practitioner

## 2019-10-30 DIAGNOSIS — E782 Mixed hyperlipidemia: Secondary | ICD-10-CM

## 2019-10-30 MED ORDER — ATORVASTATIN CALCIUM 40 MG PO TABS: 40.0000 mg | ORAL_TABLET | Freq: Every day | ORAL | 0 refills | Status: DC

## 2019-12-13 ENCOUNTER — Other Ambulatory Visit: Payer: Self-pay | Admitting: Nurse Practitioner

## 2019-12-13 DIAGNOSIS — E039 Hypothyroidism, unspecified: Secondary | ICD-10-CM

## 2019-12-17 ENCOUNTER — Ambulatory Visit (INDEPENDENT_AMBULATORY_CARE_PROVIDER_SITE_OTHER): Payer: Federal, State, Local not specified - PPO | Admitting: Podiatry

## 2019-12-17 ENCOUNTER — Other Ambulatory Visit: Payer: Self-pay | Admitting: Podiatry

## 2019-12-17 ENCOUNTER — Ambulatory Visit (INDEPENDENT_AMBULATORY_CARE_PROVIDER_SITE_OTHER): Payer: Federal, State, Local not specified - PPO

## 2019-12-17 ENCOUNTER — Other Ambulatory Visit: Payer: Self-pay

## 2019-12-17 ENCOUNTER — Encounter: Payer: Self-pay | Admitting: Podiatry

## 2019-12-17 VITALS — Temp 97.8°F

## 2019-12-17 DIAGNOSIS — M25571 Pain in right ankle and joints of right foot: Secondary | ICD-10-CM

## 2019-12-17 DIAGNOSIS — S92354A Nondisplaced fracture of fifth metatarsal bone, right foot, initial encounter for closed fracture: Secondary | ICD-10-CM | POA: Diagnosis not present

## 2019-12-17 DIAGNOSIS — M778 Other enthesopathies, not elsewhere classified: Secondary | ICD-10-CM | POA: Diagnosis not present

## 2019-12-17 MED ORDER — TRAMADOL HCL 50 MG PO TABS: 50.0000 mg | ORAL_TABLET | Freq: Three times a day (TID) | ORAL | 0 refills | Status: AC | PRN

## 2019-12-22 NOTE — Progress Notes (Signed)
Subjective: 54 year old female presents the office with concerns of pain to her right foot on the lateral aspect which been ongoing for last 3 weeks.  She denies any specific injury to her foot but she reports that she has been on her feet quite a bit.  Occasionally her ankle will roll to the outside.  She has had some swelling and she is having difficulty walking due to the discomfort.  She has had no treatment. Denies any systemic complaints such as fevers, chills, nausea, vomiting. No acute changes since last appointment, and no other complaints at this time.   Objective: AAO x3, NAD DP/PT pulses palpable bilaterally, CRT less than 3 seconds There is tenderness palpation on the fifth metatarsal base directly.  There is no pain on the course the peroneal tendons.  There is no pain to the ankle or other areas of metatarsals or digits.  There is mild edema but there is no erythema or warmth. No open lesions or pre-ulcerative lesions.  No pain with calf compression, swelling, warmth, erythema  Assessment: Fifth metatarsal fracture right foot  Plan: -All treatment options discussed with the patient including all alternatives, risks, complications.  -X-rays obtained and reviewed.  Initially seen on the lateral view of the ankle and foot x-rays were obtained.  Nondisplaced radiolucent line present in the fifth metatarsal proximally. -Recommend immobilization in a cam boot which was dispensed.  Continue ice elevate and limit activity. -Tramadol for pain as needed. -Patient encouraged to call the office with any questions, concerns, change in symptoms.   Return in about 3 weeks (around 01/07/2020) for fracture .  Repeat x-rays foot   Vivi Barrack DPM

## 2019-12-27 ENCOUNTER — Ambulatory Visit: Payer: Federal, State, Local not specified - PPO | Admitting: Nurse Practitioner

## 2019-12-30 ENCOUNTER — Other Ambulatory Visit: Payer: Self-pay

## 2019-12-31 ENCOUNTER — Encounter: Payer: Self-pay | Admitting: Family Medicine

## 2019-12-31 ENCOUNTER — Ambulatory Visit: Payer: Federal, State, Local not specified - PPO | Admitting: Family Medicine

## 2019-12-31 VITALS — BP 128/84 | HR 70 | Temp 96.5°F | Ht 68.0 in | Wt 287.4 lb

## 2019-12-31 DIAGNOSIS — Z0000 Encounter for general adult medical examination without abnormal findings: Secondary | ICD-10-CM | POA: Diagnosis not present

## 2019-12-31 DIAGNOSIS — E039 Hypothyroidism, unspecified: Secondary | ICD-10-CM

## 2019-12-31 DIAGNOSIS — E1165 Type 2 diabetes mellitus with hyperglycemia: Secondary | ICD-10-CM | POA: Diagnosis not present

## 2019-12-31 LAB — T4, FREE: Free T4: 0.65 ng/dL (ref 0.60–1.60)

## 2019-12-31 LAB — CBC
HCT: 41.4 % (ref 36.0–46.0)
Hemoglobin: 13.3 g/dL (ref 12.0–15.0)
MCHC: 32.1 g/dL (ref 30.0–36.0)
MCV: 78.1 fl (ref 78.0–100.0)
Platelets: 256 10*3/uL (ref 150.0–400.0)
RBC: 5.3 Mil/uL — ABNORMAL HIGH (ref 3.87–5.11)
RDW: 16.5 % — ABNORMAL HIGH (ref 11.5–15.5)
WBC: 3.9 10*3/uL — ABNORMAL LOW (ref 4.0–10.5)

## 2019-12-31 LAB — MICROALBUMIN / CREATININE URINE RATIO
Creatinine,U: 135.5 mg/dL
Microalb Creat Ratio: 0.6 mg/g (ref 0.0–30.0)
Microalb, Ur: 0.8 mg/dL (ref 0.0–1.9)

## 2019-12-31 LAB — LIPID PANEL
Cholesterol: 151 mg/dL (ref 0–200)
HDL: 50 mg/dL (ref >39.00)
LDL Cholesterol: 88 mg/dL (ref 0–99)
NonHDL: 101.04
Total CHOL/HDL Ratio: 3
Triglycerides: 65 mg/dL (ref 0.0–149.0)
VLDL: 13 mg/dL (ref 0.0–40.0)

## 2019-12-31 LAB — COMPREHENSIVE METABOLIC PANEL
ALT: 22 U/L (ref 0–35)
AST: 17 U/L (ref 0–37)
Albumin: 4.2 g/dL (ref 3.5–5.2)
Alkaline Phosphatase: 74 U/L (ref 39–117)
BUN: 15 mg/dL (ref 6–23)
CO2: 35 mEq/L — ABNORMAL HIGH (ref 19–32)
Calcium: 9.7 mg/dL (ref 8.4–10.5)
Chloride: 99 mEq/L (ref 96–112)
Creatinine, Ser: 0.84 mg/dL (ref 0.40–1.20)
GFR: 85.83 mL/min (ref >60.00)
Glucose, Bld: 108 mg/dL — ABNORMAL HIGH (ref 70–99)
Potassium: 3.7 mEq/L (ref 3.5–5.1)
Sodium: 139 mEq/L (ref 135–145)
Total Bilirubin: 0.5 mg/dL (ref 0.2–1.2)
Total Protein: 6.9 g/dL (ref 6.0–8.3)

## 2019-12-31 LAB — TSH: TSH: 4.67 u[IU]/mL — ABNORMAL HIGH (ref 0.35–4.50)

## 2019-12-31 LAB — HEMOGLOBIN A1C: Hgb A1c MFr Bld: 6.4 % (ref 4.6–6.5)

## 2019-12-31 NOTE — Progress Notes (Signed)
Chief Complaint  Patient presents with  . New Patient (Initial Visit)     Well Woman Emily Zavala is here for a complete physical.   Her last physical was >1 year ago.  Current diet: in general, a "healthy" diet. Current exercise: was walking; broke foot. Weight is decreasing and she denies daytime fatigue. Seatbelt? Yes  Health Maintenance Mammogram- Yes Colon cancer screening-Yes Shingrix- No Tetanus- Due HIV screening- Yes  Past Medical History:  Diagnosis Date  . Allergy    yr around- sporatic episodes   . Anemia    with fibroids   . Arthritis    ankle   . Asthma    stable without any medication.  . Chronic pain of right ankle 08/22/2016  . Diabetes mellitus without complication ([place])   . Hyperlipidemia   . Hypertension   . Thyroid disease      Past Surgical History:  Procedure Laterality Date  . ABDOMINAL HYSTERECTOMY     uterus and cervix removed, still has ovaries, 01/2010  . FOOT SURGERY Right 10/18/2012   plate and screws   . MYOMECTOMY ABDOMINAL APPROACH     10/1998  . TONSILLECTOMY     1983    Medications  Current Outpatient Medications on File Prior to Visit  Medication Sig Dispense Refill  . ACCU-CHEK AVIVA PLUS test strip USE TO TEST BLOOD SUGAR ONCE D BEFORE BREAKFAST  1  . albuterol (PROVENTIL HFA;VENTOLIN HFA) 108 (90 Base) MCG/ACT inhaler Inhale 1 puff into the lungs every 6 (six) hours as needed for wheezing or shortness of breath. 1 Inhaler 2  . amLODipine (NORVASC) 5 MG tablet Take 1 tablet (5 mg total) by mouth daily. 90 tablet 3  . atorvastatin (LIPITOR) 40 MG tablet Take 1 tablet (40 mg total) by mouth daily at 6 PM. 90 tablet 0  . blood glucose meter kit and supplies KIT Dispense based on patient and insurance preference. Use once a day (before breakfast) as directed. 1 each 1  . budesonide-formoterol (SYMBICORT) 80-4.5 MCG/ACT inhaler Inhale 2 puffs into the lungs 2 (two) times daily. Rinse mouth after each use 1 Inhaler 1  .  glimepiride (AMARYL) 2 MG tablet Take 1 tablet (2 mg total) by mouth daily with supper. 90 tablet 1  . hydrochlorothiazide (HYDRODIURIL) 25 MG tablet Take 1 tablet (25 mg total) by mouth daily. 90 tablet 1  . Lancets Misc. (UNISTIK 3) MISC 1 Units by Does not apply route 2 (two) times daily before a meal. Low flow, I.7m, 28G 100 each 11  . levothyroxine (SYNTHROID) 150 MCG tablet TAKE 1 TABLET(175 MCG) BY MOUTH DAILY BEFORE BREAKFAST 90 tablet 1  . lisinopril (PRINIVIL,ZESTRIL) 20 MG tablet Take 1 tablet (20 mg total) by mouth 2 (two) times daily. 180 tablet 3  . Omega-3 Fatty Acids (FISH OIL PO) Take by mouth.    . potassium chloride (MICRO-K) 10 MEQ CR capsule Take 2 capsules (20 mEq total) by mouth daily. 180 capsule 1  . VITAMIN D PO Take by mouth.    .Marland KitchenVITAMIN E PO Take by mouth.      Allergies Allergies  Allergen Reactions  . Levaquin [Levofloxacin In D5w] Hives    Upset stomach   . Penicillins Hives    Review of Systems: Constitutional:  no fevers Eye:  no recent significant change in vision Ear/Nose/Mouth/Throat:  Ears:  no recent change in hearing Nose/Mouth/Throat:  no complaints of nasal congestion, no sore throat Cardiovascular: no chest pain Respiratory:  no  shortness of breath Gastrointestinal:  no abdominal pain, no change in bowel habits GU:  Female: negative for dysuria or pelvic pain Musculoskeletal/Extremities: + Right foot pain Integumentary (Skin/Breast):  no abnormal skin lesions reported Neurologic:  no headaches Endocrine:  denies fatigue Hematologic/Lymphatic:  No areas of easy bleeding  Exam BP 128/84 (BP Location: Left Arm, Patient Position: Sitting, Cuff Size: Large)   Pulse 70   Temp (!) 96.5 F (35.8 C) (Temporal)   Ht 5' 8"  (1.727 m)   Wt 287 lb 6 oz (130.4 kg)   SpO2 94%   BMI 43.70 kg/m  General:  well developed, well nourished, in no apparent distress Skin:  no significant moles, warts, or growths Head:  no masses, lesions, or  tenderness Eyes:  pupils equal and round, sclera anicteric without injection Ears:  canals without lesions, TMs shiny without retraction, no obvious effusion, no erythema Nose:  nares patent, septum midline, mucosa normal, and no drainage or sinus tenderness Throat/Pharynx:  lips and gingiva without lesion; tongue and uvula midline; non-inflamed pharynx; no exudates or postnasal drainage Neck: neck supple without adenopathy, thyromegaly, or masses Lungs:  clear to auscultation, breath sounds equal bilaterally, no respiratory distress Cardio:  regular rate and rhythm, no LE edema Abdomen:  abdomen soft, nontender; bowel sounds normal; no masses or organomegaly Genital: Defer to GYN Musculoskeletal:  symmetrical muscle groups noted without atrophy or deformity Extremities:  no clubbing, cyanosis, or edema, no deformities, no skin discoloration Neuro:  gait normal; deep tendon reflexes normal and symmetric Psych: well oriented with normal range of affect and appropriate judgment/insight  Assessment and Plan  Well adult exam - Plan: CBC, Comprehensive metabolic panel, Lipid panel  Type 2 diabetes mellitus with hyperglycemia, without long-term current use of insulin (HCC) - Plan: Hemoglobin A1c, Microalbumin / creatinine urine ratio  Hypothyroidism, unspecified type - Plan: TSH, T4, free   Well 54 y.o. female. Counseled on diet and exercise. Information regarding getting the Covid vaccination provided.  She will eventually need a tetanus booster and pneumonia vaccine.  Shingrix information also provided. Other orders as above. Follow up in 3-6 months pending above. The patient voiced understanding and agreement to the plan.  [place], DO 12/31/19 12:07 PM

## 2019-12-31 NOTE — Patient Instructions (Addendum)
Give [place] 2-3 business days to get the results of your labs back.   Keep the diet clean and stay active.  The new Shingrix vaccine (for shingles) is a 2 shot series. It can make people feel low energy, achy and almost like they have the flu for 48 hours after injection. Please plan accordingly when deciding on when to get this shot. Call our office for a nurse visit appointment to get this. The second shot of the series is less severe regarding the side effects, but it still lasts 48 hours.   For all vaccinations, I think waiting 2 weeks after 2nd covid vaccine is reasonable.   Let us know if you need anything.

## 2020-01-01 ENCOUNTER — Other Ambulatory Visit: Payer: Self-pay | Admitting: Family Medicine

## 2020-01-01 DIAGNOSIS — I10 Essential (primary) hypertension: Secondary | ICD-10-CM

## 2020-01-01 MED ORDER — GLIMEPIRIDE 4 MG PO TABS: 4.0000 mg | ORAL_TABLET | Freq: Every day | ORAL | 1 refills | Status: DC

## 2020-01-01 MED ORDER — HYDROCHLOROTHIAZIDE 25 MG PO TABS: 25.0000 mg | ORAL_TABLET | Freq: Every day | ORAL | 1 refills | Status: DC

## 2020-01-02 ENCOUNTER — Ambulatory Visit: Payer: Federal, State, Local not specified - PPO | Attending: Internal Medicine

## 2020-01-02 DIAGNOSIS — Z23 Encounter for immunization: Secondary | ICD-10-CM

## 2020-01-02 NOTE — Progress Notes (Signed)
   Covid-19 Vaccination Clinic  Name:  CESILY CUOCO    MRN: [id number] DOB: [birth year]  01/02/2020  Ms. Melena was observed post Covid-19 immunization for 15 minutes without incident. She was provided with Vaccine Information Sheet and instruction to access the V-Safe system.   Ms. Loeza was instructed to call 911 with any severe reactions post vaccine: Marland Kitchen Difficulty breathing  . Swelling of face and throat  . A fast heartbeat  . A bad rash all over body  . Dizziness and weakness   Immunizations Administered    Name Date Dose VIS Date Route   Pfizer COVID-19 Vaccine 01/02/2020  9:54 AM 0.3 mL 09/13/2019 Intramuscular   Manufacturer: [employer], [employer]   Lot: [id number]   NDC: [address]-2

## 2020-01-06 ENCOUNTER — Telehealth: Payer: Self-pay | Admitting: Family Medicine

## 2020-01-06 DIAGNOSIS — I10 Essential (primary) hypertension: Secondary | ICD-10-CM

## 2020-01-06 MED ORDER — AMLODIPINE BESYLATE 5 MG PO TABS: 5.0000 mg | ORAL_TABLET | Freq: Every day | ORAL | 3 refills | Status: DC

## 2020-01-06 NOTE — Telephone Encounter (Signed)
Medication: amLODipine (NORVASC) 5 MG tablet [[id number]]   Has the patient contacted their pharmacy? Yes.   (If no, request that the patient contact the pharmacy for the refill.) (If yes, when and what did the pharmacy advise?)  Preferred Pharmacy (with phone number or street name): [facility] #[id number] [place], [place] - [address] AT [facility] OF [facility] & [address]  [address] [place] [address]  Phone:  [phone] Fax:  [phone]  DEA #:  [id number]  Agent: Please be advised that RX refills may take up to 3 business days. We ask that you follow-up with your pharmacy.

## 2020-01-06 NOTE — Telephone Encounter (Signed)
Refill done as requested 

## 2020-01-07 ENCOUNTER — Other Ambulatory Visit: Payer: Self-pay

## 2020-01-07 ENCOUNTER — Encounter: Payer: Self-pay | Admitting: Podiatry

## 2020-01-07 ENCOUNTER — Ambulatory Visit (INDEPENDENT_AMBULATORY_CARE_PROVIDER_SITE_OTHER): Payer: Federal, State, Local not specified - PPO

## 2020-01-07 ENCOUNTER — Ambulatory Visit (INDEPENDENT_AMBULATORY_CARE_PROVIDER_SITE_OTHER): Payer: Federal, State, Local not specified - PPO | Admitting: Podiatry

## 2020-01-07 DIAGNOSIS — S92354D Nondisplaced fracture of fifth metatarsal bone, right foot, subsequent encounter for fracture with routine healing: Secondary | ICD-10-CM

## 2020-01-07 DIAGNOSIS — M79671 Pain in right foot: Secondary | ICD-10-CM

## 2020-01-07 DIAGNOSIS — S92354A Nondisplaced fracture of fifth metatarsal bone, right foot, initial encounter for closed fracture: Secondary | ICD-10-CM

## 2020-01-13 DIAGNOSIS — S92354D Nondisplaced fracture of fifth metatarsal bone, right foot, subsequent encounter for fracture with routine healing: Secondary | ICD-10-CM | POA: Insufficient documentation

## 2020-01-13 NOTE — Progress Notes (Signed)
Subjective: 54 year old female presents the office today for follow-up evaluation of fifth metatarsal fracture.  She states that she has not been wearing the cam boot as it was causing discomfort.  She still has some pain to the foot but has improved.  She has noticed some peeling skin on the area where it was swollen previously.  Still gets some swelling which is intermittent.  No recent injury or falls.  Denies any systemic complaints such as fevers, chills, nausea, vomiting. No acute changes since last appointment, and no other complaints at this time.   Objective: AAO x3, NAD DP/PT pulses palpable bilaterally, CRT less than 3 seconds There is still tenderness palpation on the fifth metatarsal base on the area of the fracture.  There is no other areas of discomfort.  Flexor, extensor tendons appear to be intact. No open lesions or pre-ulcerative lesions.  No pain with calf compression, swelling, warmth, erythema  Assessment: Fifth metatarsal fracture right foot  Plan: -All treatment options discussed with the patient including all alternatives, risks, complications.  -X-rays obtained and reviewed.  Fracture present of the fifth metatarsal base.  There is callus forming along the fracture.  No other evidence of acute fracture identified. -We try dispensing large sized cam boot however this was too big.  She is wearing the other boot into the office in order to fit her.  Continue ice elevate.  She has only taken 1 tramadol. -Patient encouraged to call the office with any questions, concerns, change in symptoms.   Return in about 3 weeks (around 01/28/2020) for fracture follow up .  Repeat x-rays foot   Vivi Barrack DPM

## 2020-01-28 ENCOUNTER — Ambulatory Visit: Payer: Federal, State, Local not specified - PPO | Admitting: Podiatry

## 2020-01-28 ENCOUNTER — Other Ambulatory Visit: Payer: Self-pay

## 2020-01-28 ENCOUNTER — Encounter: Payer: Self-pay | Admitting: Family Medicine

## 2020-01-28 ENCOUNTER — Ambulatory Visit: Payer: Federal, State, Local not specified - PPO | Admitting: Family Medicine

## 2020-01-28 VITALS — BP 120/82 | HR 82 | Temp 97.0°F | Ht 68.0 in | Wt 291.5 lb

## 2020-01-28 DIAGNOSIS — R300 Dysuria: Secondary | ICD-10-CM | POA: Diagnosis not present

## 2020-01-28 LAB — POC URINALSYSI DIPSTICK (AUTOMATED)
Bilirubin, UA: NEGATIVE
Blood, UA: POSITIVE
Glucose, UA: NEGATIVE
Ketones, UA: NEGATIVE
Leukocytes, UA: NEGATIVE
Nitrite, UA: NEGATIVE
Protein, UA: NEGATIVE
Spec Grav, UA: 1.015 (ref 1.010–1.025)
Urobilinogen, UA: 0.2 E.U./dL
pH, UA: 6.5 (ref 5.0–8.0)

## 2020-01-28 MED ORDER — NITROFURANTOIN MONOHYD MACRO 100 MG PO CAPS: 100.0000 mg | ORAL_CAPSULE | Freq: Two times a day (BID) | ORAL | 0 refills | Status: AC

## 2020-01-28 NOTE — Progress Notes (Signed)
Chief Complaint  Patient presents with  . Urinary Frequency    Emily Zavala is a 54 y.o. female here for possible UTI.  Duration: 3 days. Symptoms: Dysuria, urinary frequency, fatigue, hesitancy, incomplete emptying Denies: fever, bleeding, vaginal discharge Hx of recurrent UTI? No Happened after intercourse.  Past Medical History:  Diagnosis Date  . Allergy    yr around- sporatic episodes   . Anemia    with fibroids   . Arthritis    ankle   . Asthma    stable without any medication.  . Chronic pain of right ankle 08/22/2016  . Diabetes mellitus without complication (HCC)   . Hyperlipidemia   . Hypertension   . Thyroid disease      BP 120/82 (BP Location: Left Arm, Patient Position: Sitting, Cuff Size: Large)   Pulse 82   Temp (!) 97 F (36.1 C) (Temporal)   Ht 5\' 8"  (1.727 m)   Wt 291 lb 8 oz (132.2 kg)   SpO2 93%   BMI 44.32 kg/m  General: Awake, alert, appears stated age Heart: RRR Lungs: CTAB, normal respiratory effort, no accessory muscle usage Abd: BS+, soft, NT, ND, no masses or organomegaly MSK: No CVA tenderness, neg Lloyd's sign Psych: Age appropriate judgment and insight  Dysuria - Plan: POCT Urinalysis Dipstick (Automated), Urine Culture, nitrofurantoin, macrocrystal-monohydrate, (MACROBID) 100 MG capsule  Orders as above. Stay hydrated. Seek immediate care if pt starts to develop fevers, new/worsening symptoms, uncontrollable N/V. F/u prn. The patient voiced understanding and agreement to the plan.  [place], DO 01/28/20 9:51 AM

## 2020-01-28 NOTE — Patient Instructions (Signed)
Stay hydrated.   Warning signs/symptoms: Uncontrollable nausea/vomiting, fevers, worsening symptoms despite treatment, confusion.  Give us around 2 business days to get culture back to you.  Let us know if you need anything. 

## 2020-01-29 ENCOUNTER — Ambulatory Visit: Payer: Federal, State, Local not specified - PPO

## 2020-01-29 LAB — URINE CULTURE
MICRO NUMBER:: 10410514
SPECIMEN QUALITY:: ADEQUATE

## 2020-02-04 ENCOUNTER — Ambulatory Visit: Payer: Federal, State, Local not specified - PPO

## 2020-02-11 ENCOUNTER — Ambulatory Visit: Payer: Federal, State, Local not specified - PPO

## 2020-02-20 ENCOUNTER — Ambulatory Visit: Payer: Federal, State, Local not specified - PPO | Admitting: Podiatry

## 2020-03-03 ENCOUNTER — Ambulatory Visit: Payer: Federal, State, Local not specified - PPO

## 2020-03-12 ENCOUNTER — Other Ambulatory Visit: Payer: Self-pay | Admitting: Nurse Practitioner

## 2020-03-12 DIAGNOSIS — I10 Essential (primary) hypertension: Secondary | ICD-10-CM

## 2020-03-12 DIAGNOSIS — E782 Mixed hyperlipidemia: Secondary | ICD-10-CM

## 2020-03-13 ENCOUNTER — Other Ambulatory Visit: Payer: Self-pay | Admitting: Family Medicine

## 2020-03-13 DIAGNOSIS — I10 Essential (primary) hypertension: Secondary | ICD-10-CM

## 2020-03-13 MED ORDER — LISINOPRIL 20 MG PO TABS: 20.0000 mg | ORAL_TABLET | Freq: Two times a day (BID) | ORAL | 3 refills | Status: DC

## 2020-03-16 ENCOUNTER — Telehealth: Payer: Self-pay | Admitting: Family Medicine

## 2020-03-16 DIAGNOSIS — I10 Essential (primary) hypertension: Secondary | ICD-10-CM

## 2020-03-16 DIAGNOSIS — E782 Mixed hyperlipidemia: Secondary | ICD-10-CM

## 2020-03-16 MED ORDER — ATORVASTATIN CALCIUM 40 MG PO TABS: 40.0000 mg | ORAL_TABLET | Freq: Every day | ORAL | 0 refills | Status: DC

## 2020-03-16 MED ORDER — POTASSIUM CHLORIDE ER 10 MEQ PO CPCR: 20.0000 meq | ORAL_CAPSULE | Freq: Every day | ORAL | 0 refills | Status: DC

## 2020-03-16 NOTE — Telephone Encounter (Signed)
Refills done.

## 2020-03-16 NOTE — Telephone Encounter (Signed)
Medication: atorvastatin (LIPITOR) 20 MG tablet  potassium chloride (MICRO-K) 10 MEQ CR capsule       Has the patient contacted their pharmacy?  (If no, request that the patient contact the pharmacy for the refill.) (If yes, when and what did the pharmacy advise?)     Preferred Pharmacy (with phone number or street name): [facility] #[id number] [place], [place] - [address] AT [facility] OF [facility] & [address]  [address] [place] [address]  Phone:  [phone] Fax:  [phone]      Agent: Please be advised that RX refills may take up to 3 business days. We ask that you follow-up with your pharmacy.

## 2020-03-26 ENCOUNTER — Ambulatory Visit: Payer: Federal, State, Local not specified - PPO | Admitting: Podiatry

## 2020-04-17 ENCOUNTER — Other Ambulatory Visit: Payer: Self-pay | Admitting: Family Medicine

## 2020-04-17 ENCOUNTER — Other Ambulatory Visit: Payer: Self-pay | Admitting: Pediatric Cardiology

## 2020-04-17 DIAGNOSIS — Z1231 Encounter for screening mammogram for malignant neoplasm of breast: Secondary | ICD-10-CM

## 2020-05-13 ENCOUNTER — Other Ambulatory Visit: Payer: Self-pay

## 2020-05-13 ENCOUNTER — Ambulatory Visit
Admission: RE | Admit: 2020-05-13 | Discharge: 2020-05-13 | Disposition: A | Discharge status: Home / Self Care | Payer: Federal, State, Local not specified - PPO | Source: Ambulatory Visit | Attending: Family Medicine | Admitting: Family Medicine

## 2020-05-13 DIAGNOSIS — Z1231 Encounter for screening mammogram for malignant neoplasm of breast: Secondary | ICD-10-CM

## 2020-06-10 ENCOUNTER — Other Ambulatory Visit: Payer: Self-pay | Admitting: Family Medicine

## 2020-06-10 DIAGNOSIS — I10 Essential (primary) hypertension: Secondary | ICD-10-CM

## 2020-06-26 ENCOUNTER — Ambulatory Visit: Payer: Federal, State, Local not specified - PPO | Admitting: Family Medicine

## 2020-07-03 ENCOUNTER — Ambulatory Visit (INDEPENDENT_AMBULATORY_CARE_PROVIDER_SITE_OTHER): Payer: Federal, State, Local not specified - PPO | Admitting: Family Medicine

## 2020-07-03 ENCOUNTER — Other Ambulatory Visit: Payer: Self-pay

## 2020-07-03 ENCOUNTER — Encounter: Payer: Self-pay | Admitting: Family Medicine

## 2020-07-03 VITALS — BP 120/78 | HR 87 | Temp 98.8°F | Ht 68.0 in | Wt 290.5 lb

## 2020-07-03 DIAGNOSIS — E039 Hypothyroidism, unspecified: Secondary | ICD-10-CM | POA: Diagnosis not present

## 2020-07-03 DIAGNOSIS — I10 Essential (primary) hypertension: Secondary | ICD-10-CM | POA: Diagnosis not present

## 2020-07-03 DIAGNOSIS — E1165 Type 2 diabetes mellitus with hyperglycemia: Secondary | ICD-10-CM

## 2020-07-03 MED ORDER — LEVOTHYROXINE SODIUM 150 MCG PO TABS: 150.0000 ug | ORAL_TABLET | Freq: Every day | ORAL | 2 refills | Status: DC

## 2020-07-03 MED ORDER — FLOVENT HFA 110 MCG/ACT IN AERO: 2.0000 | INHALATION_SPRAY | Freq: Two times a day (BID) | RESPIRATORY_TRACT | 12 refills | Status: AC

## 2020-07-03 NOTE — Patient Instructions (Addendum)
Give [place] 2-3 business days to get the results of your labs back.   Consider weight resistance exercise.  Consider monthly selfies to track your body progression.  Let me know if you change your mind about the tetanus and pneumonia shot.  Let us know if you need anything.

## 2020-07-03 NOTE — Progress Notes (Signed)
Subjective:   Chief Complaint  Patient presents with  . Follow-up    Emily Zavala is a 54 y.o. female here for follow-up of diabetes.   [name]'s self monitored glucose range is 90's, low 100's  Patient confirms hypoglycemic reactions rarely. She checks her glucose levels several times per week. Patient does not require insulin.    Medications include: Amaryl 4 mg/d Diet is healthy overall. Exercise: walking  Hypertension Patient presents for hypertension follow up. She does monitor home blood pressures. Blood pressures ranging on average from 120's/70's. She is compliant with medications- HCTZ 25 mg/d, lisinopril 20 mg/d, Norvasc 5 mg/d. Patient has these side effects of medication: none Diet/exercise as above.   Hypothyroidism Patient presents for follow-up of hypothyroidism.  Reports compliance with medication- levothyroxine 150 mg/d. Current symptoms include: denies fatigue, weight changes, heat/cold intolerance, bowel/skin changes or CVS symptoms  Past Medical History:  Diagnosis Date  . Allergy    yr around- sporatic episodes   . Anemia    with fibroids   . Arthritis    ankle   . Asthma    stable without any medication.  . Chronic pain of right ankle 08/22/2016  . Diabetes mellitus without complication (HCC)   . Hyperlipidemia   . Hypertension   . Thyroid disease      Related testing: Retinal exam: Done Pneumovax: not done  Objective:  BP 120/78 (BP Location: Left Arm, Patient Position: Sitting, Cuff Size: Large)   Pulse 87   Temp 98.8 F (37.1 C) (Oral)   Ht 5\' 8"  (1.727 m)   Wt 290 lb 8 oz (131.8 kg)   SpO2 95%   BMI 44.17 kg/m  General:  Well developed, well nourished, in no apparent distress Skin:  Warm, no pallor or diaphoresis Head:  Normocephalic, atraumatic Eyes:  Pupils equal and round, sclera anicteric without injection  Lungs:  CTAB, no access msc use Cardio:  RRR, no bruits Musculoskeletal:  Symmetrical muscle groups noted  without atrophy or deformity Neuro:  Sensation intact to pinprick on feet Psych: Age appropriate judgment and insight  Assessment:   Type 2 diabetes mellitus with hyperglycemia, without long-term current use of insulin (HCC) - Plan: Lipid panel, Hemoglobin A1c, Comprehensive metabolic panel  Essential hypertension, benign  Hypothyroidism, unspecified type - Plan: levothyroxine (SYNTHROID) 150 MCG tablet, TSH   Plan:   1. Cont Amaryl 4 mg/d, Counseled on diet and exercise.  2. Stable. Continue Lisinopril 20 mg/d, HCTZ 25 mg, Norvasc 5 mg/d. 3. Cont Synthroid 150 mcg/d. F/u in 6 mo for CPE. The patient voiced understanding and agreement to the plan.  [place], DO 07/03/20 10:08 AM

## 2020-07-03 NOTE — Addendum Note (Signed)
Addended by: Thelma Barge D on: 07/03/2020 10:38 AM   Modules accepted: Orders

## 2020-07-06 ENCOUNTER — Other Ambulatory Visit: Payer: Self-pay | Admitting: Family Medicine

## 2020-07-06 DIAGNOSIS — E876 Hypokalemia: Secondary | ICD-10-CM

## 2020-07-07 LAB — TEST AUTHORIZATION

## 2020-07-07 LAB — LIPID PANEL
Cholesterol: 162 mg/dL (ref <200)
HDL: 63 mg/dL (ref >=50)
LDL Cholesterol (Calc): 85 mg/dL (calc)
Non-HDL Cholesterol (Calc): 99 mg/dL (calc) (ref <130)
Total CHOL/HDL Ratio: 2.6 (calc) (ref <5.0)
Triglycerides: 68 mg/dL (ref <150)

## 2020-07-07 LAB — HEMOGLOBIN A1C
Hgb A1c MFr Bld: 6.4 % of total Hgb — ABNORMAL HIGH (ref <5.7)
Mean Plasma Glucose: 137 (calc)
eAG (mmol/L): 7.6 (calc)

## 2020-07-07 LAB — COMPREHENSIVE METABOLIC PANEL
AG Ratio: 1.5 (calc) (ref 1.0–2.5)
ALT: 19 U/L (ref 6–29)
AST: 21 U/L (ref 10–35)
Albumin: 4.2 g/dL (ref 3.6–5.1)
Alkaline phosphatase (APISO): 73 U/L (ref 37–153)
BUN: 13 mg/dL (ref 7–25)
CO2: 29 mmol/L (ref 20–32)
Calcium: 9.6 mg/dL (ref 8.6–10.4)
Chloride: 99 mmol/L (ref 98–110)
Creat: 0.8 mg/dL (ref 0.50–1.05)
Globulin: 2.8 g/dL (calc) (ref 1.9–3.7)
Glucose, Bld: 107 mg/dL — ABNORMAL HIGH (ref 65–99)
Potassium: 3.4 mmol/L — ABNORMAL LOW (ref 3.5–5.3)
Sodium: 140 mmol/L (ref 135–146)
Total Bilirubin: 0.7 mg/dL (ref 0.2–1.2)
Total Protein: 7 g/dL (ref 6.1–8.1)

## 2020-07-07 LAB — T4, FREE: Free T4: 0.8 ng/dL (ref 0.8–1.8)

## 2020-07-07 LAB — TSH: TSH: 4.82 mIU/L — ABNORMAL HIGH

## 2020-07-14 ENCOUNTER — Other Ambulatory Visit: Payer: Self-pay | Admitting: Family Medicine

## 2020-07-14 ENCOUNTER — Other Ambulatory Visit (INDEPENDENT_AMBULATORY_CARE_PROVIDER_SITE_OTHER): Payer: Federal, State, Local not specified - PPO

## 2020-07-14 DIAGNOSIS — E876 Hypokalemia: Secondary | ICD-10-CM | POA: Diagnosis not present

## 2020-07-14 DIAGNOSIS — I10 Essential (primary) hypertension: Secondary | ICD-10-CM

## 2020-07-14 LAB — BASIC METABOLIC PANEL
BUN: 20 mg/dL (ref 6–23)
CO2: 34 mEq/L — ABNORMAL HIGH (ref 19–32)
Calcium: 9.7 mg/dL (ref 8.4–10.5)
Chloride: 97 mEq/L (ref 96–112)
Creatinine, Ser: 0.96 mg/dL (ref 0.40–1.20)
GFR: 67.28 mL/min (ref >60.00)
Glucose, Bld: 114 mg/dL — ABNORMAL HIGH (ref 70–99)
Potassium: 3.1 mEq/L — ABNORMAL LOW (ref 3.5–5.1)
Sodium: 139 mEq/L (ref 135–145)

## 2020-07-14 MED ORDER — POTASSIUM CHLORIDE ER 10 MEQ PO CPCR: 20.0000 meq | ORAL_CAPSULE | Freq: Two times a day (BID) | ORAL | 0 refills | Status: DC

## 2020-07-15 ENCOUNTER — Other Ambulatory Visit: Payer: Self-pay | Admitting: Family Medicine

## 2020-07-15 ENCOUNTER — Telehealth: Payer: Self-pay | Admitting: Family Medicine

## 2020-07-15 DIAGNOSIS — E782 Mixed hyperlipidemia: Secondary | ICD-10-CM

## 2020-07-15 NOTE — Telephone Encounter (Signed)
CallerAudrina Zavala CallBack # [phone]   Pt called and said that she has been double charged for OV appt on 07/03/2020 for the amount of $25.00 which is what she would normally pay for her copay.  She would like a call back from someone with Billing as soon as possible to resolve this matter.    Thank you!

## 2020-07-15 NOTE — Telephone Encounter (Signed)
Spoke with patient and she had forgotten our Ipay system was down ad we were not collecting co-pays on October 1,2021, thus the reason for being billed.

## 2020-08-11 ENCOUNTER — Other Ambulatory Visit (INDEPENDENT_AMBULATORY_CARE_PROVIDER_SITE_OTHER): Payer: Federal, State, Local not specified - PPO

## 2020-08-11 DIAGNOSIS — I10 Essential (primary) hypertension: Secondary | ICD-10-CM

## 2020-08-11 DIAGNOSIS — E876 Hypokalemia: Secondary | ICD-10-CM

## 2020-08-11 LAB — MAGNESIUM: Magnesium: 1.9 mg/dL (ref 1.5–2.5)

## 2020-08-11 LAB — BASIC METABOLIC PANEL
BUN: 20 mg/dL (ref 6–23)
CO2: 32 mEq/L (ref 19–32)
Calcium: 10 mg/dL (ref 8.4–10.5)
Chloride: 98 mEq/L (ref 96–112)
Creatinine, Ser: 0.87 mg/dL (ref 0.40–1.20)
GFR: 75.98 mL/min (ref >60.00)
Glucose, Bld: 113 mg/dL — ABNORMAL HIGH (ref 70–99)
Potassium: 3.3 mEq/L — ABNORMAL LOW (ref 3.5–5.1)
Sodium: 138 mEq/L (ref 135–145)

## 2020-08-18 ENCOUNTER — Ambulatory Visit (INDEPENDENT_AMBULATORY_CARE_PROVIDER_SITE_OTHER): Payer: Federal, State, Local not specified - PPO

## 2020-08-18 ENCOUNTER — Other Ambulatory Visit: Payer: Self-pay

## 2020-08-18 ENCOUNTER — Ambulatory Visit: Payer: Federal, State, Local not specified - PPO | Admitting: Podiatry

## 2020-08-18 DIAGNOSIS — S92354D Nondisplaced fracture of fifth metatarsal bone, right foot, subsequent encounter for fracture with routine healing: Secondary | ICD-10-CM | POA: Diagnosis not present

## 2020-08-18 DIAGNOSIS — M79671 Pain in right foot: Secondary | ICD-10-CM | POA: Diagnosis not present

## 2020-08-18 DIAGNOSIS — M779 Enthesopathy, unspecified: Secondary | ICD-10-CM

## 2020-08-18 NOTE — Patient Instructions (Signed)
Look at getting voltaren gel and also getting [employer]

## 2020-08-24 NOTE — Progress Notes (Signed)
Subjective: 54 year old female presents the office today for concerns of pain, discomfort to her right foot and ankle.  She states that she was having discomfort and she points to the dorsal aspect midfoot however she is not had any symptoms over the last couple of days.  She denies any recent injury or falls.  She states that she did not follow-up after the last appointment for the fifth metatarsal fracture as the foot was feeling better.  She did get back into exercise and has been doing well however the pain gradually increase the top of the foot but seems to be resolving at this point.  She said no recent injury or falls.  She has not had some swelling to the area.  No open lesions. Denies any systemic complaints such as fevers, chills, nausea, vomiting. No acute changes since last appointment, and no other complaints at this time.   Objective: AAO x3, NAD DP/PT pulses palpable bilaterally, CRT less than 3 seconds Mild edema present to the dorsal aspect midfoot and Lisfranc joint of the right foot.  There is no area of pinpoint tenderness identified today.  There is no erythema or warmth.  Flexor, extensor tendons appear to be intact.  MMT 5/5. No pain with calf compression, swelling, warmth, erythema  Assessment: 54 year old female with capsulitis right foot  Plan: -All treatment options discussed with the patient including all alternatives, risks, complications.  -X-rays obtained and reviewed.  There is no evidence of acute fracture identified today.  Area of fracture of the fifth metatarsal appears to be healed. -We discussed changing shoe gear to get more support when she is exercising as I think this is contributing to the dorsal midfoot pain.  She can use Voltaren gel as needed, ice.  If the symptoms recur to let me know.  Otherwise I will see her as needed. -Patient encouraged to call the office with any questions, concerns, change in symptoms.   Vivi Barrack DPM

## 2020-10-01 ENCOUNTER — Ambulatory Visit: Payer: Federal, State, Local not specified - PPO | Attending: Internal Medicine

## 2020-10-01 DIAGNOSIS — Z23 Encounter for immunization: Secondary | ICD-10-CM

## 2020-10-01 NOTE — Progress Notes (Signed)
   Covid-19 Vaccination Clinic  Name:  SHANE MELBY    MRN: [id number] DOB: Jan 22, 1966  10/01/2020  Ms. Suman was observed post Covid-19 immunization for 15 minutes without incident. She was provided with Vaccine Information Sheet and instruction to access the V-Safe system.   Ms. Balash was instructed to call 911 with any severe reactions post vaccine: Marland Kitchen Difficulty breathing  . Swelling of face and throat  . A fast heartbeat  . A bad rash all over body  . Dizziness and weakness   Immunizations Administered    Name Date Dose VIS Date Route   Pfizer COVID-19 Vaccine 10/01/2020  2:05 PM 0.3 mL 07/22/2020 Intramuscular   Manufacturer: [employer], [employer]   Lot: [id number]   NDC: [address]-7      Covid-19 Vaccination Clinic  Name:  ORA BOLLIG    MRN: [id number] DOB: [birth year]  10/01/2020  Ms. Shiroma was observed post Covid-19 immunization for 15 minutes without incident. She was provided with Vaccine Information Sheet and instruction to access the V-Safe system.   Ms. Kasa was instructed to call 911 with any severe reactions post vaccine: Marland Kitchen Difficulty breathing  . Swelling of face and throat  . A fast heartbeat  . A bad rash all over body  . Dizziness and weakness   Immunizations Administered    Name Date Dose VIS Date Route   Pfizer COVID-19 Vaccine 10/01/2020  2:05 PM 0.3 mL 07/22/2020 Intramuscular   Manufacturer: [employer], [employer]   Lot: [id number]   NDC: [address]-0

## 2020-12-07 ENCOUNTER — Other Ambulatory Visit: Payer: Self-pay | Admitting: Medical

## 2020-12-07 DIAGNOSIS — I10 Essential (primary) hypertension: Secondary | ICD-10-CM

## 2021-01-01 ENCOUNTER — Ambulatory Visit: Payer: Federal, State, Local not specified - PPO | Admitting: Family Medicine

## 2021-01-01 ENCOUNTER — Encounter: Payer: Self-pay | Admitting: Family Medicine

## 2021-01-01 ENCOUNTER — Other Ambulatory Visit: Payer: Self-pay

## 2021-01-01 VITALS — BP 138/82 | HR 93 | Temp 99.3°F | Ht 68.0 in | Wt 283.1 lb

## 2021-01-01 DIAGNOSIS — I10 Essential (primary) hypertension: Secondary | ICD-10-CM | POA: Diagnosis not present

## 2021-01-01 DIAGNOSIS — E039 Hypothyroidism, unspecified: Secondary | ICD-10-CM | POA: Diagnosis not present

## 2021-01-01 DIAGNOSIS — E1165 Type 2 diabetes mellitus with hyperglycemia: Secondary | ICD-10-CM | POA: Diagnosis not present

## 2021-01-01 DIAGNOSIS — Z114 Encounter for screening for human immunodeficiency virus [HIV]: Secondary | ICD-10-CM

## 2021-01-01 LAB — COMPREHENSIVE METABOLIC PANEL
ALT: 22 U/L (ref 0–35)
AST: 17 U/L (ref 0–37)
Albumin: 4.3 g/dL (ref 3.5–5.2)
Alkaline Phosphatase: 79 U/L (ref 39–117)
BUN: 15 mg/dL (ref 6–23)
CO2: 34 mEq/L — ABNORMAL HIGH (ref 19–32)
Calcium: 9.9 mg/dL (ref 8.4–10.5)
Chloride: 100 mEq/L (ref 96–112)
Creatinine, Ser: 0.75 mg/dL (ref 0.40–1.20)
GFR: 90.54 mL/min (ref >60.00)
Glucose, Bld: 113 mg/dL — ABNORMAL HIGH (ref 70–99)
Potassium: 3.4 mEq/L — ABNORMAL LOW (ref 3.5–5.1)
Sodium: 142 mEq/L (ref 135–145)
Total Bilirubin: 0.6 mg/dL (ref 0.2–1.2)
Total Protein: 7.3 g/dL (ref 6.0–8.3)

## 2021-01-01 LAB — MICROALBUMIN / CREATININE URINE RATIO
Creatinine,U: 119 mg/dL
Microalb Creat Ratio: 1.2 mg/g (ref 0.0–30.0)
Microalb, Ur: 1.4 mg/dL (ref 0.0–1.9)

## 2021-01-01 LAB — LIPID PANEL
Cholesterol: 163 mg/dL (ref 0–200)
HDL: 59 mg/dL (ref >39.00)
LDL Cholesterol: 87 mg/dL (ref 0–99)
NonHDL: 104.02
Total CHOL/HDL Ratio: 3
Triglycerides: 85 mg/dL (ref 0.0–149.0)
VLDL: 17 mg/dL (ref 0.0–40.0)

## 2021-01-01 LAB — TSH: TSH: 0.02 u[IU]/mL — ABNORMAL LOW (ref 0.35–4.50)

## 2021-01-01 LAB — T4, FREE: Free T4: 1.26 ng/dL (ref 0.60–1.60)

## 2021-01-01 MED ORDER — GLIMEPIRIDE 4 MG PO TABS: ORAL_TABLET | ORAL | 2 refills | Status: DC

## 2021-01-01 NOTE — Patient Instructions (Addendum)
Give [place] 2-3 business days to get the results of your labs back.   Keep the diet clean and stay active.  Have your eye provider send [place] a message when you have your exam.  For the swelling in your lower extremities, be sure to elevate your legs when able, mind the salt intake, stay physically active and consider wearing compression stockings.  Let us know if you need anything.

## 2021-01-01 NOTE — Progress Notes (Signed)
Subjective:   Chief Complaint  Patient presents with  . Follow-up    Emily Zavala is a 55 y.o. female here for follow-up of diabetes.   Emily Zavala does not routinely monitor sugars routinely.  Patient does not require insulin.   Medications include: Amaryl 4 mg/d Diet is healthy.  Exercise: walking. No CP or SOB.   Hypertension Patient presents for hypertension follow up. She does not routinely monitor home blood pressures. She is compliant with medications- HCTZ 25 mg/d, lisinopril 20 mg/d, Norvasc 5 mg/d Patient has these side effects of medication: none Diet/exercise as above.  Hypothyroidism Patient presents for follow-up of hypothyroidism.  Reports compliance with medication- levothyroxine 150 mcg/d. She believes her dose should be unchanged   Past Medical History:  Diagnosis Date  . Allergy    yr around- sporatic episodes   . Anemia    with fibroids   . Arthritis    ankle   . Asthma    stable without any medication.  . Chronic pain of right ankle 08/22/2016  . Diabetes mellitus without complication (HCC)   . Hyperlipidemia   . Hypertension   . Thyroid disease      Related testing: Retinal exam: Due Pneumovax: declined  Objective:  BP 138/82 (BP Location: Left Arm, Patient Position: Sitting, Cuff Size: Large)   Pulse 93   Temp 99.3 F (37.4 C) (Oral)   Ht 5\' 8"  (1.727 m)   Wt 283 lb 2 oz (128.4 kg)   SpO2 96%   BMI 43.05 kg/m  General:  Well developed, well nourished, in no apparent distress Skin:  Warm, no pallor or diaphoresis Head:  Normocephalic, atraumatic Eyes:  Pupils equal and round, sclera anicteric without injection  Lungs:  CTAB, no access msc use Cardio:  RRR, no bruits, no LE edema Psych: Age appropriate judgment and insight  Assessment:   Type 2 diabetes mellitus with hyperglycemia, without long-term current use of insulin (HCC) - Plan: Comprehensive metabolic panel, Lipid panel, Microalbumin / creatinine urine ratio,  Ambulatory referral to Ophthalmology  Essential hypertension, benign  Hypothyroidism, unspecified type - Plan: TSH, T4, free  Screening for HIV (human immunodeficiency virus) - Plan: HIV Antibody (routine testing w rflx)   Plan:   1. Ck a1c. Might not need to monitor sugars at home. Counseled on diet and exercise. 2. Doing well. Cont Norvasc 5 mg/d.  3. Cont Synthroid 150 mcg/d.  F/u in 6 mo. The patient voiced understanding and agreement to the plan.  [place], DO 01/01/21 10:47 AM

## 2021-01-04 ENCOUNTER — Other Ambulatory Visit: Payer: Self-pay | Admitting: Family Medicine

## 2021-01-04 DIAGNOSIS — E118 Type 2 diabetes mellitus with unspecified complications: Secondary | ICD-10-CM

## 2021-01-04 DIAGNOSIS — E876 Hypokalemia: Secondary | ICD-10-CM

## 2021-01-04 DIAGNOSIS — E1165 Type 2 diabetes mellitus with hyperglycemia: Secondary | ICD-10-CM

## 2021-01-04 LAB — HIV ANTIBODY (ROUTINE TESTING W REFLEX): HIV 1&2 Ab, 4th Generation: NONREACTIVE

## 2021-01-08 ENCOUNTER — Other Ambulatory Visit: Payer: Self-pay | Admitting: Family Medicine

## 2021-01-08 ENCOUNTER — Other Ambulatory Visit (INDEPENDENT_AMBULATORY_CARE_PROVIDER_SITE_OTHER): Payer: Federal, State, Local not specified - PPO

## 2021-01-08 DIAGNOSIS — E118 Type 2 diabetes mellitus with unspecified complications: Secondary | ICD-10-CM

## 2021-01-08 DIAGNOSIS — E1165 Type 2 diabetes mellitus with hyperglycemia: Secondary | ICD-10-CM | POA: Diagnosis not present

## 2021-01-08 DIAGNOSIS — E876 Hypokalemia: Secondary | ICD-10-CM

## 2021-01-08 LAB — BASIC METABOLIC PANEL
BUN: 17 mg/dL (ref 6–23)
CO2: 33 mEq/L — ABNORMAL HIGH (ref 19–32)
Calcium: 10.1 mg/dL (ref 8.4–10.5)
Chloride: 96 mEq/L (ref 96–112)
Creatinine, Ser: 0.98 mg/dL (ref 0.40–1.20)
GFR: 65.67 mL/min (ref >60.00)
Glucose, Bld: 110 mg/dL — ABNORMAL HIGH (ref 70–99)
Potassium: 3.3 mEq/L — ABNORMAL LOW (ref 3.5–5.1)
Sodium: 138 mEq/L (ref 135–145)

## 2021-01-08 LAB — HEMOGLOBIN A1C: Hgb A1c MFr Bld: 6.4 % (ref 4.6–6.5)

## 2021-01-17 ENCOUNTER — Other Ambulatory Visit: Payer: Self-pay | Admitting: Family Medicine

## 2021-01-17 ENCOUNTER — Other Ambulatory Visit: Payer: Self-pay | Admitting: Medical

## 2021-01-17 DIAGNOSIS — E039 Hypothyroidism, unspecified: Secondary | ICD-10-CM

## 2021-01-17 DIAGNOSIS — I10 Essential (primary) hypertension: Secondary | ICD-10-CM

## 2021-01-18 ENCOUNTER — Other Ambulatory Visit: Payer: Self-pay | Admitting: Family Medicine

## 2021-01-18 DIAGNOSIS — E782 Mixed hyperlipidemia: Secondary | ICD-10-CM

## 2021-01-25 ENCOUNTER — Other Ambulatory Visit: Payer: Self-pay | Admitting: Family Medicine

## 2021-01-25 DIAGNOSIS — I10 Essential (primary) hypertension: Secondary | ICD-10-CM

## 2021-04-08 ENCOUNTER — Other Ambulatory Visit: Payer: Self-pay | Admitting: Family Medicine

## 2021-04-08 DIAGNOSIS — Z1231 Encounter for screening mammogram for malignant neoplasm of breast: Secondary | ICD-10-CM

## 2021-04-20 ENCOUNTER — Other Ambulatory Visit: Payer: Self-pay | Admitting: Family Medicine

## 2021-04-20 DIAGNOSIS — E782 Mixed hyperlipidemia: Secondary | ICD-10-CM

## 2021-05-01 IMAGING — MG DIGITAL SCREENING BILAT W/ TOMO W/ CAD
6 of 10 series · 6 of 30 positions shown · non-contrast
Comparison: Previous exam(s).

CLINICAL DATA: Screening.

EXAM:
DIGITAL SCREENING BILATERAL MAMMOGRAM WITH TOMO AND CAD

[R CC synth-2D]
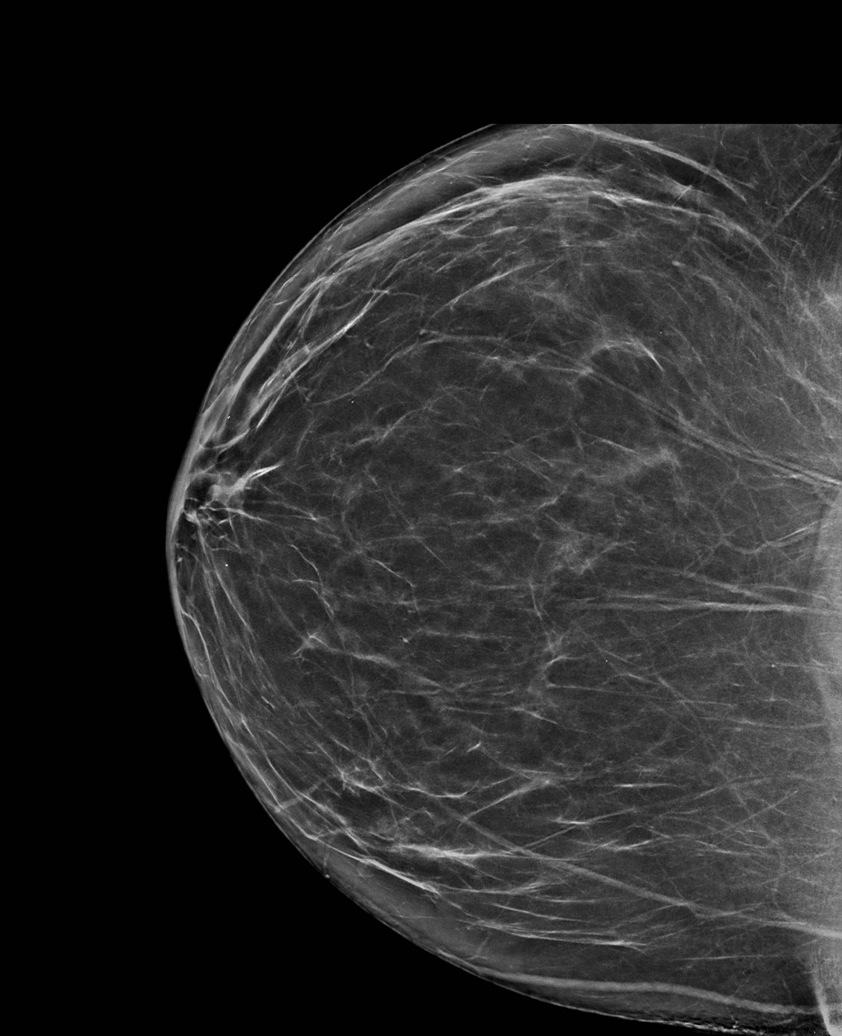

[R MLO synth-2D (1 of 2)]
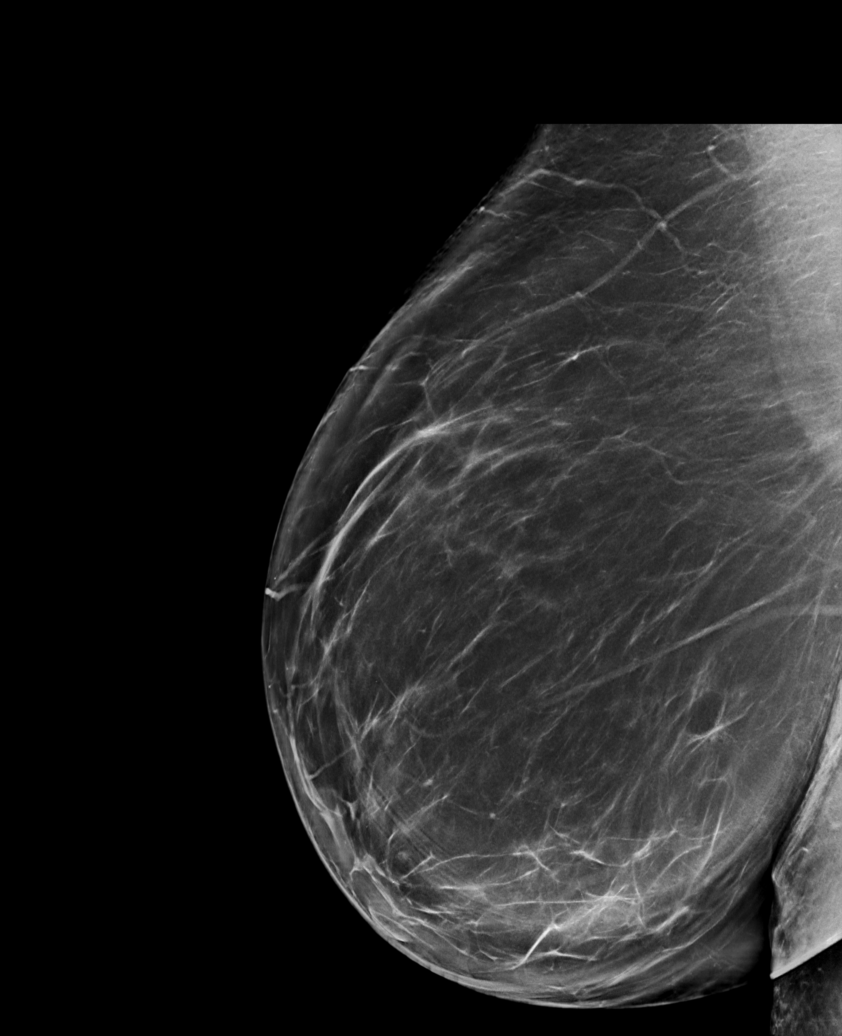

[L CC synth-2D]
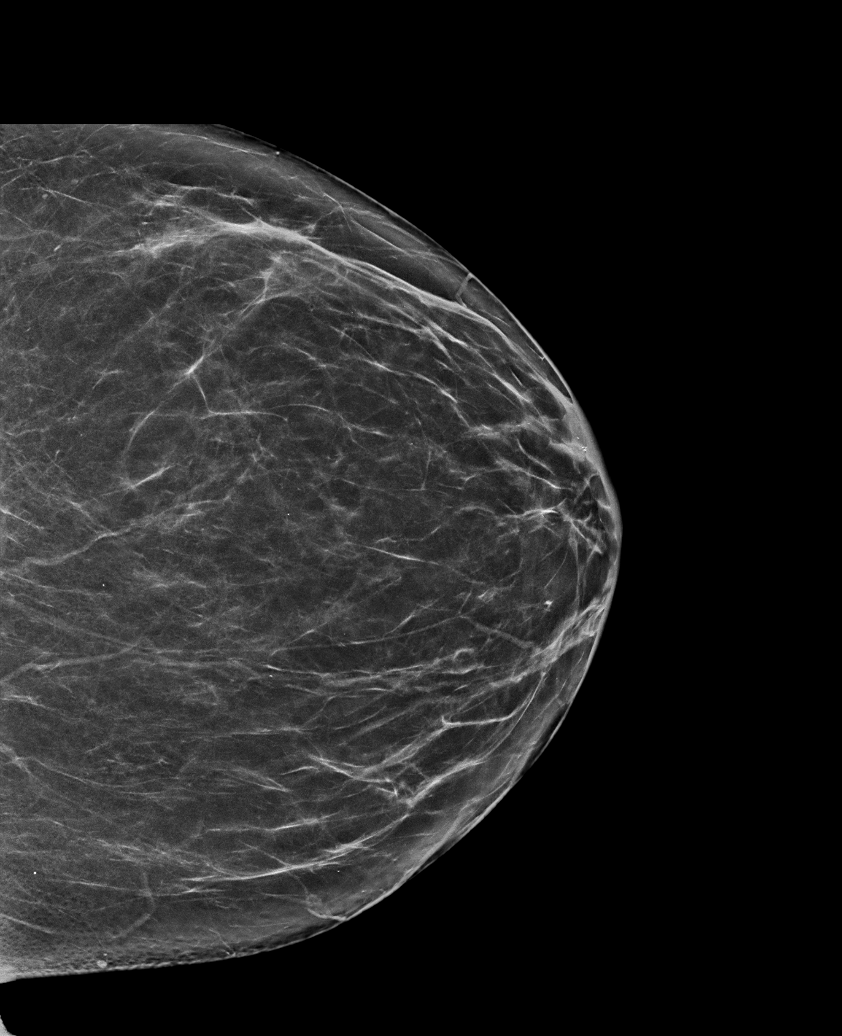

[R MLO synth-2D (2 of 2)]
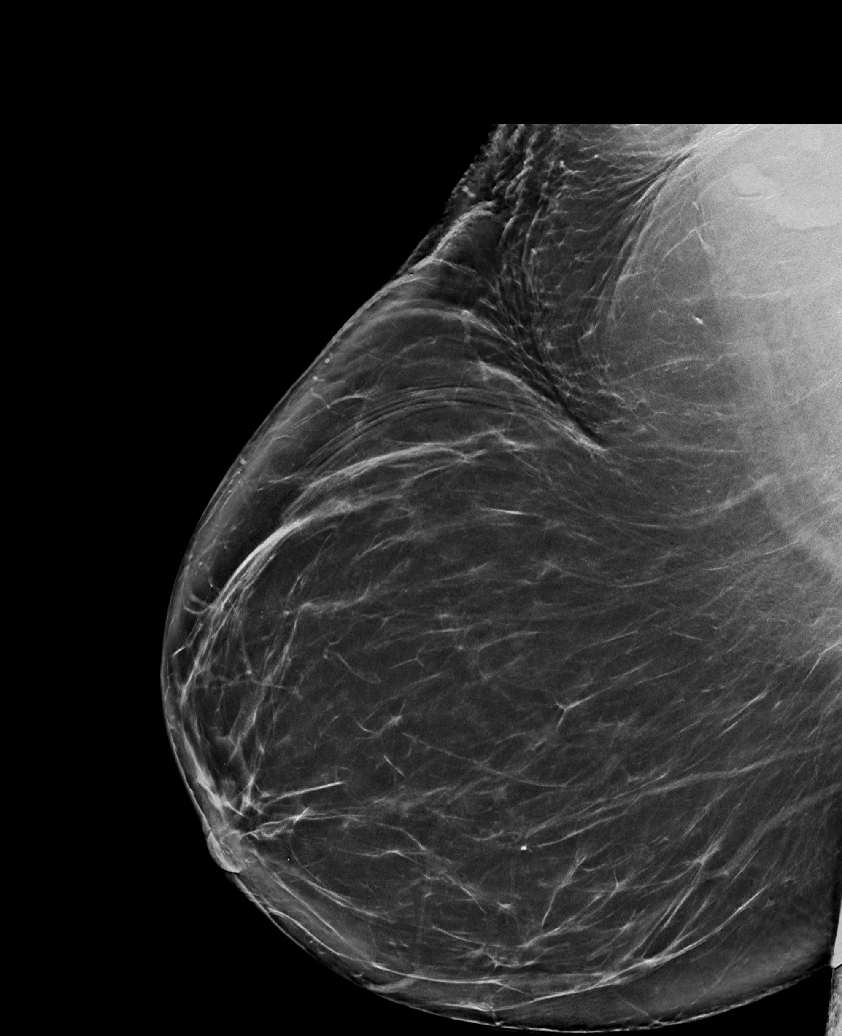

[L MLO synth-2D]
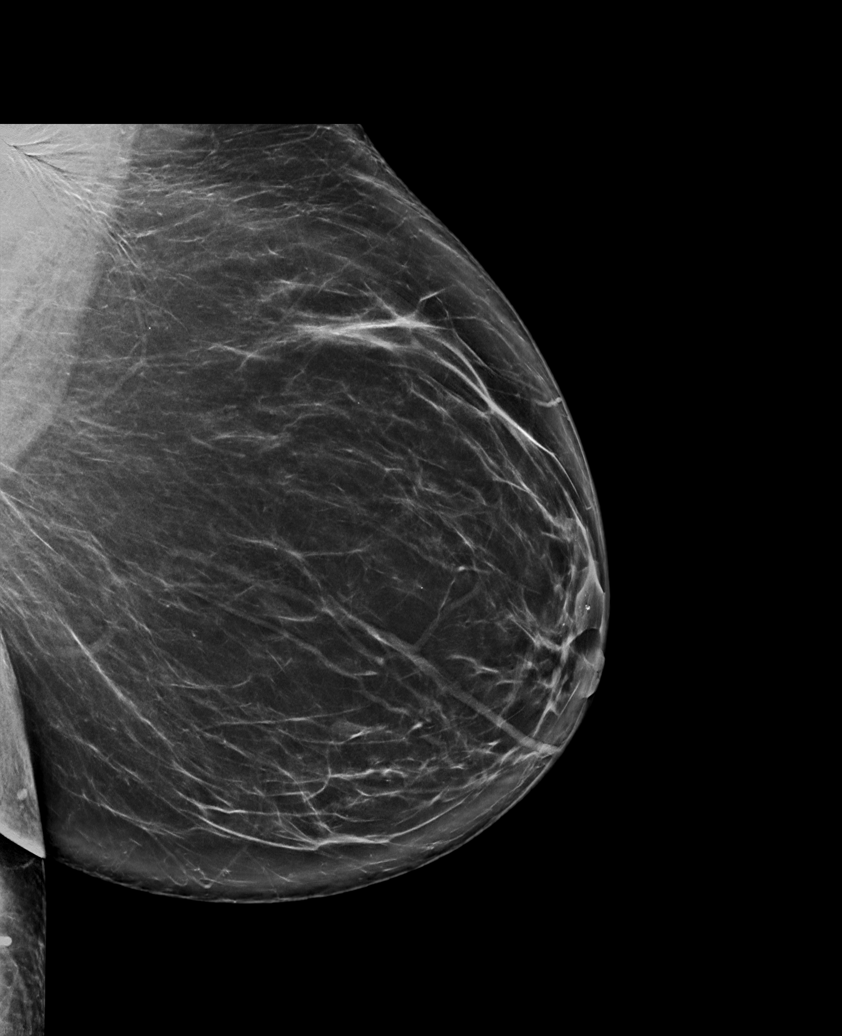

[R MLO tomo · tomo slice 51/102.0]
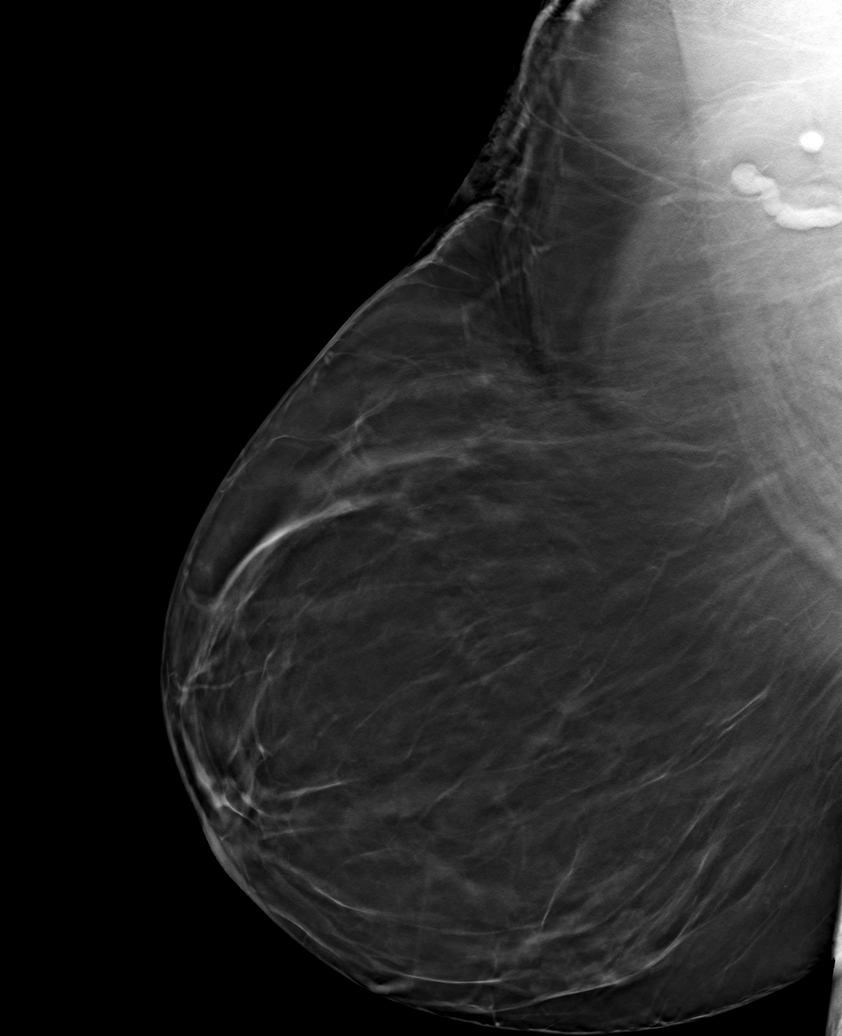

[6 of 30 positions shown; findings below may reference images not displayed]

ACR Breast Density Category b: There are scattered areas of
fibroglandular density.
FINDINGS: There are no findings suspicious for malignancy. Images were
processed with CAD.
IMPRESSION: No mammographic evidence of malignancy. A result letter of this
screening mammogram will be mailed directly to the patient.

RECOMMENDATION:
Screening mammogram in one year. (Code:[id number])

BI-RADS CATEGORY  1: Negative.

## 2021-05-11 ENCOUNTER — Encounter: Payer: Self-pay | Admitting: Family Medicine

## 2021-05-11 ENCOUNTER — Other Ambulatory Visit: Payer: Self-pay | Admitting: Family Medicine

## 2021-05-11 DIAGNOSIS — E119 Type 2 diabetes mellitus without complications: Secondary | ICD-10-CM | POA: Diagnosis not present

## 2021-05-11 DIAGNOSIS — H524 Presbyopia: Secondary | ICD-10-CM | POA: Diagnosis not present

## 2021-05-11 DIAGNOSIS — H40013 Open angle with borderline findings, low risk, bilateral: Secondary | ICD-10-CM | POA: Diagnosis not present

## 2021-05-11 DIAGNOSIS — E089 Diabetes mellitus due to underlying condition without complications: Secondary | ICD-10-CM | POA: Diagnosis not present

## 2021-05-11 LAB — HM DIABETES EYE EXAM

## 2021-05-31 ENCOUNTER — Encounter: Payer: Self-pay | Admitting: Family Medicine

## 2021-06-01 ENCOUNTER — Other Ambulatory Visit: Payer: Self-pay

## 2021-06-01 ENCOUNTER — Ambulatory Visit
Admission: RE | Admit: 2021-06-01 | Discharge: 2021-06-01 | Disposition: A | Discharge status: Home / Self Care | Payer: Federal, State, Local not specified - PPO | Source: Ambulatory Visit | Attending: Family Medicine | Admitting: Family Medicine

## 2021-06-01 DIAGNOSIS — Z1231 Encounter for screening mammogram for malignant neoplasm of breast: Secondary | ICD-10-CM

## 2021-06-14 ENCOUNTER — Other Ambulatory Visit: Payer: Self-pay | Admitting: Family Medicine

## 2021-06-14 DIAGNOSIS — I10 Essential (primary) hypertension: Secondary | ICD-10-CM

## 2021-07-06 ENCOUNTER — Encounter: Payer: Federal, State, Local not specified - PPO | Admitting: Family Medicine

## 2021-07-13 ENCOUNTER — Encounter: Payer: Federal, State, Local not specified - PPO | Admitting: Family Medicine

## 2021-07-21 ENCOUNTER — Other Ambulatory Visit: Payer: Self-pay

## 2021-07-22 ENCOUNTER — Other Ambulatory Visit (INDEPENDENT_AMBULATORY_CARE_PROVIDER_SITE_OTHER): Payer: Federal, State, Local not specified - PPO

## 2021-07-22 ENCOUNTER — Encounter: Payer: Self-pay | Admitting: Family Medicine

## 2021-07-22 ENCOUNTER — Ambulatory Visit (INDEPENDENT_AMBULATORY_CARE_PROVIDER_SITE_OTHER): Payer: Federal, State, Local not specified - PPO | Admitting: Family Medicine

## 2021-07-22 VITALS — BP 130/82 | HR 78 | Temp 98.0°F | Resp 18 | Ht 68.0 in | Wt 267.4 lb

## 2021-07-22 DIAGNOSIS — E1165 Type 2 diabetes mellitus with hyperglycemia: Secondary | ICD-10-CM

## 2021-07-22 DIAGNOSIS — Z1159 Encounter for screening for other viral diseases: Secondary | ICD-10-CM

## 2021-07-22 DIAGNOSIS — E039 Hypothyroidism, unspecified: Secondary | ICD-10-CM

## 2021-07-22 DIAGNOSIS — Z0000 Encounter for general adult medical examination without abnormal findings: Secondary | ICD-10-CM | POA: Diagnosis not present

## 2021-07-22 LAB — CBC
HCT: 42.4 % (ref 36.0–46.0)
Hemoglobin: 13.9 g/dL (ref 12.0–15.0)
MCHC: 32.7 g/dL (ref 30.0–36.0)
MCV: 77.8 fl — ABNORMAL LOW (ref 78.0–100.0)
Platelets: 281 10*3/uL (ref 150.0–400.0)
RBC: 5.45 Mil/uL — ABNORMAL HIGH (ref 3.87–5.11)
RDW: 15.7 % — ABNORMAL HIGH (ref 11.5–15.5)
WBC: 3 10*3/uL — ABNORMAL LOW (ref 4.0–10.5)

## 2021-07-22 LAB — T4, FREE: Free T4: 0.4 ng/dL — ABNORMAL LOW (ref 0.60–1.60)

## 2021-07-22 LAB — LIPID PANEL
Cholesterol: 163 mg/dL (ref 0–200)
HDL: 56 mg/dL (ref >39.00)
LDL Cholesterol: 94 mg/dL (ref 0–99)
NonHDL: 107.08
Total CHOL/HDL Ratio: 3
Triglycerides: 67 mg/dL (ref 0.0–149.0)
VLDL: 13.4 mg/dL (ref 0.0–40.0)

## 2021-07-22 LAB — COMPREHENSIVE METABOLIC PANEL
ALT: 22 U/L (ref 0–35)
AST: 27 U/L (ref 0–37)
Albumin: 4.5 g/dL (ref 3.5–5.2)
Alkaline Phosphatase: 70 U/L (ref 39–117)
BUN: 10 mg/dL (ref 6–23)
CO2: 33 mEq/L — ABNORMAL HIGH (ref 19–32)
Calcium: 10.2 mg/dL (ref 8.4–10.5)
Chloride: 96 mEq/L (ref 96–112)
Creatinine, Ser: 0.92 mg/dL (ref 0.40–1.20)
GFR: 70.58 mL/min (ref >60.00)
Glucose, Bld: 123 mg/dL — ABNORMAL HIGH (ref 70–99)
Potassium: 3.2 mEq/L — ABNORMAL LOW (ref 3.5–5.1)
Sodium: 138 mEq/L (ref 135–145)
Total Bilirubin: 0.8 mg/dL (ref 0.2–1.2)
Total Protein: 7.8 g/dL (ref 6.0–8.3)

## 2021-07-22 LAB — TSH: TSH: 11.79 u[IU]/mL — ABNORMAL HIGH (ref 0.35–5.50)

## 2021-07-22 LAB — HEMOGLOBIN A1C: Hgb A1c MFr Bld: 6.3 % (ref 4.6–6.5)

## 2021-07-22 NOTE — Progress Notes (Signed)
Chief Complaint  Patient presents with   Annual Exam    Pt states fasting      Well Woman Emily Zavala is here for a complete physical.   Her last physical was >1 year ago.  Current diet: in general, a "healthy" diet. Current exercise: walking. Weight is down a little and she denies fatigue out of ordinary. Seatbelt? Yes  Health Maintenance Mammogram- Yes Colon cancer screening-Yes Shingrix- No Tetanus- No Hep C screening- No HIV screening- Yes  Past Medical History:  Diagnosis Date   Allergy    yr around- sporatic episodes    Anemia    with fibroids    Arthritis    ankle    Asthma    stable without any medication.   Chronic pain of right ankle 08/22/2016   Diabetes mellitus without complication ([place])    Hyperlipidemia    Hypertension    Thyroid disease      Past Surgical History:  Procedure Laterality Date   ABDOMINAL HYSTERECTOMY     uterus and cervix removed, still has ovaries, 01/2010   FOOT SURGERY Right 10/18/2012   plate and screws    MYOMECTOMY ABDOMINAL APPROACH     10/1998   TONSILLECTOMY     1983    Medications  Current Outpatient Medications on File Prior to Visit  Medication Sig Dispense Refill   ACCU-CHEK AVIVA PLUS test strip USE TO TEST BLOOD SUGAR ONCE D BEFORE BREAKFAST  1   albuterol (PROVENTIL HFA;VENTOLIN HFA) 108 (90 Base) MCG/ACT inhaler Inhale 1 puff into the lungs every 6 (six) hours as needed for wheezing or shortness of breath. 1 Inhaler 2   amLODipine (NORVASC) 5 MG tablet TAKE 1 TABLET(5 MG) BY MOUTH DAILY 90 tablet 3   atorvastatin (LIPITOR) 40 MG tablet TAKE 1 TABLET(40 MG) BY MOUTH DAILY 90 tablet 0   blood glucose meter kit and supplies KIT Dispense based on patient and insurance preference. Use once a day (before breakfast) as directed. 1 each 1   fluticasone (FLOVENT HFA) 110 MCG/ACT inhaler Inhale 2 puffs into the lungs in the morning and at bedtime. 1 each 12   glimepiride (AMARYL) 4 MG tablet TAKE 1 TABLET(4 MG) BY  MOUTH DAILY WITH BREAKFAST 90 tablet 2   hydrochlorothiazide (HYDRODIURIL) 25 MG tablet TAKE 1 TABLET(25 MG) BY MOUTH DAILY 90 tablet 1   Lancets Misc. (UNISTIK 3) MISC 1 Units by Does not apply route 2 (two) times daily before a meal. Low flow, I.65mm, 28G 100 each 11   levothyroxine (SYNTHROID) 150 MCG tablet TAKE 1 TABLET BY MOUTH DAILY BEFORE BREAKFAST. 90 tablet 2   lisinopril (ZESTRIL) 20 MG tablet TAKE 1 TABLET(20 MG) BY MOUTH TWICE DAILY 180 tablet 3   Omega-3 Fatty Acids (FISH OIL PO) Take by mouth.     potassium chloride (MICRO-K) 10 MEQ CR capsule TAKE 2 CAPSULES(20 MEQ) BY MOUTH TWICE DAILY 360 capsule 1   PREVIDENT 0.2 % SOLN Take 10 mLs by mouth at bedtime.     VITAMIN D PO Take by mouth.     VITAMIN E PO Take by mouth.      Allergies Allergies  Allergen Reactions   Levaquin [Levofloxacin In D5w] Hives    Upset stomach    Penicillins Hives    Review of Systems: Constitutional:  no unexpected weight changes Eye:  no recent significant change in vision Ear/Nose/Mouth/Throat:  Ears:  no recent change in hearing Nose/Mouth/Throat:  no complaints of nasal congestion, no sore  throat Cardiovascular: no chest pain Respiratory:  no shortness of breath Gastrointestinal:  no abdominal pain, no change in bowel habits GU:  Female: negative for dysuria or pelvic pain Musculoskeletal/Extremities:  no pain of the joints Integumentary (Skin/Breast):  no abnormal skin lesions reported Neurologic:  no headaches Endocrine:  denies fatigue  Exam BP 130/82 (BP Location: Right Arm, Patient Position: Sitting, Cuff Size: Large)   Pulse 78   Temp 98 F (36.7 C) (Oral)   Resp 18   Ht _0  (1.727 m)   Wt 267 lb 6.4 oz (121.3 kg)   SpO2 97%   BMI 40.66 kg/m  General:  well developed, well nourished, in no apparent distress Skin:  no significant moles, warts, or growths Head:  no masses, lesions, or tenderness Eyes:  pupils equal and round, sclera anicteric without injection Ears:   canals without lesions, TMs shiny without retraction, no obvious effusion, no erythema Nose:  nares patent, septum midline, mucosa normal, and no drainage or sinus tenderness Throat/Pharynx:  lips and gingiva without lesion; tongue and uvula midline; non-inflamed pharynx; no exudates or postnasal drainage Neck: neck supple without adenopathy, thyromegaly, or masses Lungs:  clear to auscultation, breath sounds equal bilaterally, no respiratory distress Cardio:  regular rate and rhythm, no LE edema Abdomen:  abdomen soft, nontender; bowel sounds normal; no masses or organomegaly Genital: Defer to GYN Musculoskeletal:  symmetrical muscle groups noted without atrophy or deformity Extremities:  no clubbing, cyanosis, or edema, no deformities, no skin discoloration Neuro: sensation intact to pinprick b/l; gait normal; deep tendon reflexes normal and symmetric Psych: well oriented with normal range of affect and appropriate judgment/insight  Assessment and Plan  Well adult exam  Type 2 diabetes mellitus with hyperglycemia, without long-term current use of insulin (HCC) - Plan: Lipid panel, Hemoglobin A1c, Comprehensive metabolic panel, CBC  Encounter for hepatitis C screening test for low risk patient - Plan: Hepatitis C antibody  Hypothyroidism, unspecified type - Plan: TSH, T4, free   Well 54 y.o. female. Counseled on diet and exercise. Doing a good job losing weight.  Will ck labs at [facility]. Declines tdap, flu, PCV20, Shingrix.  Other orders as above. Follow up in 6 mo for DM visit or prn. The patient voiced understanding and agreement to the plan.  [place], DO 07/22/21 10:56 AM

## 2021-07-22 NOTE — Patient Instructions (Addendum)
Give [place] 2-3 business days to get the results of your labs back.   Keep the diet clean and stay active. You've done an excellent job losing weight. Keep up the good work.   The new Shingrix vaccine (for shingles) is a 2 shot series. It can make people feel low energy, achy and almost like they have the flu for 48 hours after injection. Please plan accordingly when deciding on when to get this shot. Call our office for a nurse visit appointment to get this. The second shot of the series is less severe regarding the side effects, but it still lasts 48 hours.   I recommend getting a tetanus shot, PCV20 (pneumonia vaccine) and updated covid shot.   Let us know if you need anything.

## 2021-07-22 NOTE — Addendum Note (Signed)
Addended by: Blenda Mounts on: 07/22/2021 12:45 PM   Modules accepted: Orders

## 2021-07-23 ENCOUNTER — Other Ambulatory Visit: Payer: Self-pay | Admitting: Family Medicine

## 2021-07-23 ENCOUNTER — Telehealth: Payer: Self-pay

## 2021-07-23 DIAGNOSIS — I10 Essential (primary) hypertension: Secondary | ICD-10-CM

## 2021-07-23 DIAGNOSIS — E782 Mixed hyperlipidemia: Secondary | ICD-10-CM

## 2021-07-23 LAB — HEPATITIS C ANTIBODY
Hepatitis C Ab: NONREACTIVE
SIGNAL TO CUT-OFF: 0.06 (ref <1.00)

## 2021-07-23 MED ORDER — GLIMEPIRIDE 4 MG PO TABS: ORAL_TABLET | ORAL | 2 refills | Status: DC

## 2021-07-23 MED ORDER — ATORVASTATIN CALCIUM 40 MG PO TABS: ORAL_TABLET | ORAL | 2 refills | Status: DC

## 2021-07-23 MED ORDER — AMLODIPINE BESYLATE 5 MG PO TABS: ORAL_TABLET | ORAL | 2 refills | Status: DC

## 2021-07-23 MED ORDER — HYDROCHLOROTHIAZIDE 25 MG PO TABS: ORAL_TABLET | ORAL | 2 refills | Status: DC

## 2021-07-23 NOTE — Telephone Encounter (Signed)
Pt called today stating she did not get refills called into her pharmacy for the following after the CPE yesterday: Glimepiride, hydrochlorothiazide, atorvastatin & amlodipine.  Pharmacy is [facility] [phone].

## 2021-07-26 ENCOUNTER — Other Ambulatory Visit: Payer: Federal, State, Local not specified - PPO

## 2021-07-26 ENCOUNTER — Other Ambulatory Visit: Payer: Self-pay | Admitting: Family Medicine

## 2021-07-26 DIAGNOSIS — E876 Hypokalemia: Secondary | ICD-10-CM

## 2021-07-26 DIAGNOSIS — E119 Type 2 diabetes mellitus without complications: Secondary | ICD-10-CM

## 2021-07-26 NOTE — Addendum Note (Signed)
Addended by: Blenda Mounts on: 07/26/2021 09:33 AM   Modules accepted: Orders

## 2021-07-27 LAB — IRON,TIBC AND FERRITIN PANEL
%SAT: 31 % (calc) (ref 16–45)
Ferritin: 236 ng/mL — ABNORMAL HIGH (ref 16–232)
Iron: 102 ug/dL (ref 45–160)
TIBC: 327 mcg/dL (calc) (ref 250–450)

## 2021-07-27 LAB — MAGNESIUM: Magnesium: 1.9 mg/dL (ref 1.5–2.5)

## 2021-09-18 ENCOUNTER — Other Ambulatory Visit: Payer: Self-pay

## 2021-09-18 ENCOUNTER — Emergency Department (HOSPITAL_COMMUNITY)
Admission: EM | Admit: 2021-09-18 | Discharge: 2021-09-18 | Disposition: A | Discharge status: Home / Self Care | Payer: Federal, State, Local not specified - PPO | Attending: Emergency Medicine | Admitting: Emergency Medicine

## 2021-09-18 ENCOUNTER — Encounter (HOSPITAL_COMMUNITY): Payer: Self-pay

## 2021-09-18 ENCOUNTER — Emergency Department (HOSPITAL_COMMUNITY): Payer: Federal, State, Local not specified - PPO

## 2021-09-18 DIAGNOSIS — R079 Chest pain, unspecified: Secondary | ICD-10-CM | POA: Diagnosis not present

## 2021-09-18 DIAGNOSIS — M7531 Calcific tendinitis of right shoulder: Secondary | ICD-10-CM

## 2021-09-18 DIAGNOSIS — Z7984 Long term (current) use of oral hypoglycemic drugs: Secondary | ICD-10-CM | POA: Insufficient documentation

## 2021-09-18 DIAGNOSIS — I10 Essential (primary) hypertension: Secondary | ICD-10-CM | POA: Diagnosis not present

## 2021-09-18 DIAGNOSIS — E876 Hypokalemia: Secondary | ICD-10-CM | POA: Diagnosis not present

## 2021-09-18 DIAGNOSIS — R978 Other abnormal tumor markers: Secondary | ICD-10-CM | POA: Diagnosis not present

## 2021-09-18 DIAGNOSIS — J45909 Unspecified asthma, uncomplicated: Secondary | ICD-10-CM | POA: Insufficient documentation

## 2021-09-18 DIAGNOSIS — M79601 Pain in right arm: Secondary | ICD-10-CM | POA: Diagnosis not present

## 2021-09-18 DIAGNOSIS — E039 Hypothyroidism, unspecified: Secondary | ICD-10-CM | POA: Insufficient documentation

## 2021-09-18 DIAGNOSIS — Z7951 Long term (current) use of inhaled steroids: Secondary | ICD-10-CM | POA: Insufficient documentation

## 2021-09-18 DIAGNOSIS — R7989 Other specified abnormal findings of blood chemistry: Secondary | ICD-10-CM

## 2021-09-18 DIAGNOSIS — Z79899 Other long term (current) drug therapy: Secondary | ICD-10-CM | POA: Diagnosis not present

## 2021-09-18 DIAGNOSIS — I4510 Unspecified right bundle-branch block: Secondary | ICD-10-CM | POA: Diagnosis not present

## 2021-09-18 DIAGNOSIS — M25511 Pain in right shoulder: Secondary | ICD-10-CM | POA: Diagnosis not present

## 2021-09-18 DIAGNOSIS — M19011 Primary osteoarthritis, right shoulder: Secondary | ICD-10-CM | POA: Diagnosis not present

## 2021-09-18 DIAGNOSIS — E119 Type 2 diabetes mellitus without complications: Secondary | ICD-10-CM | POA: Diagnosis not present

## 2021-09-18 LAB — I-STAT BETA HCG BLOOD, ED (MC, WL, AP ONLY): I-stat hCG, quantitative: <5 m[IU]/mL (ref <5)

## 2021-09-18 LAB — BASIC METABOLIC PANEL
Anion gap: 8 (ref 5–15)
BUN: 19 mg/dL (ref 6–20)
CO2: 32 mmol/L (ref 22–32)
Calcium: 9.3 mg/dL (ref 8.9–10.3)
Chloride: 98 mmol/L (ref 98–111)
Creatinine, Ser: 1.11 mg/dL — ABNORMAL HIGH (ref 0.44–1.00)
GFR, Estimated: 59 mL/min — ABNORMAL LOW (ref >60)
Glucose, Bld: 121 mg/dL — ABNORMAL HIGH (ref 70–99)
Potassium: 2.9 mmol/L — ABNORMAL LOW (ref 3.5–5.1)
Sodium: 138 mmol/L (ref 135–145)

## 2021-09-18 LAB — CBC
HCT: 40.1 % (ref 36.0–46.0)
Hemoglobin: 13 g/dL (ref 12.0–15.0)
MCH: 26.3 pg (ref 26.0–34.0)
MCHC: 32.4 g/dL (ref 30.0–36.0)
MCV: 81 fL (ref 80.0–100.0)
Platelets: 269 10*3/uL (ref 150–400)
RBC: 4.95 MIL/uL (ref 3.87–5.11)
RDW: 15.7 % — ABNORMAL HIGH (ref 11.5–15.5)
WBC: 6.2 10*3/uL (ref 4.0–10.5)
nRBC: 0 % (ref 0.0–0.2)

## 2021-09-18 LAB — TROPONIN I (HIGH SENSITIVITY): Troponin I (High Sensitivity): 2 ng/L (ref <18)

## 2021-09-18 MED ORDER — POTASSIUM CHLORIDE CRYS ER 20 MEQ PO TBCR
40.0000 meq | EXTENDED_RELEASE_TABLET | Freq: Once | ORAL | Status: DC
  Filled 2021-09-18: qty 2

## 2021-09-18 MED ORDER — KETOROLAC TROMETHAMINE 60 MG/2ML IM SOLN
30.0000 mg | Freq: Once | INTRAMUSCULAR | Status: AC
  Administered 2021-09-18: 30 mg via INTRAMUSCULAR
  Filled 2021-09-18: qty 2

## 2021-09-18 NOTE — ED Provider Notes (Signed)
[facility] EMERGENCY DEPARTMENT Provider Note   CSN: [id number] Arrival date & time: 09/18/21  0320     History Chief Complaint  Patient presents with   Arm Pain    Emily Zavala is a 55 y.o. female with PMHx HTN, HLD, Diabetes, Hypothyroidism who presents to the ED today with complaint of gradual onset, constant, atraumatic, achy, R shoulder/arm pain x 2 days.  Patient is right-hand dominant.  She states that she was sitting on the couch when she began having an aching sensation in her right shoulder that radiates down her right arm.  She states that pain is exacerbated with movement and specifically external rotation.  She has not taken anything specifically for pain.  She mentions that while in triage today she accidentally hit her arm and noted a tingling sensation to her hand that has since dissipated.  She denies any radiation into her chest however does admit she was concerned she could be having a heart attack.  She denies any shortness of breath, diaphoresis, nausea, vomiting.  She is a non-smoker.  She does mention that she has recently lost 80 pounds with walking and dietary changes.  She states that she walks approximately 3 hours/week and does not have any chest pain associated with same.   The history is provided by the patient and medical records.      Past Medical History:  Diagnosis Date   Allergy    yr around- sporatic episodes    Anemia    with fibroids    Arthritis    ankle    Asthma    stable without any medication.   Chronic pain of right ankle 08/22/2016   Diabetes mellitus without complication ([place])    Hyperlipidemia    Hypertension    Thyroid disease     Patient Active Problem List   Diagnosis Date Noted   Nondisplaced fracture of fifth right metatarsal bone with routine healing 01/13/2020   S/P hysterectomy 07/05/2019   Chronic pain of right ankle 08/22/2016   Diabetes mellitus ([place]) 06/24/2016   Essential hypertension,  benign 06/23/2016   Hypothyroidism 06/23/2016   Hyperlipidemia 06/23/2016   Asthma with acute exacerbation 06/23/2016    Past Surgical History:  Procedure Laterality Date   ABDOMINAL HYSTERECTOMY     uterus and cervix removed, still has ovaries, 01/2010   FOOT SURGERY Right 10/18/2012   plate and screws    MYOMECTOMY ABDOMINAL APPROACH     10/1998   TONSILLECTOMY     1983     OB History     Gravida  0   Para  0   Term  0   Preterm  0   AB  0   Living  0      SAB  0   IAB  0   Ectopic  0   Multiple  0   Live Births  0           Family History  Problem Relation Age of Onset   Hypertension Mother    Cancer Mother        breast   Diabetes Mother    Dementia Mother    CAD Mother        s/p CABG   Breast cancer Mother    Diabetes Father    Cancer Father        unknown   Mental illness Father    Cancer Maternal Grandmother    Stroke Maternal Grandmother  Breast cancer Maternal Grandmother    Colon cancer Neg Hx    Colon polyps Neg Hx    Rectal cancer Neg Hx    Stomach cancer Neg Hx    Esophageal cancer Neg Hx     Social History   Tobacco Use   Smoking status: Never   Smokeless tobacco: Never  Substance Use Topics   Alcohol use: No   Drug use: No    Home Medications Prior to Admission medications   Medication Sig Start Date End Date Taking? Authorizing Provider  ACCU-CHEK AVIVA PLUS test strip USE TO TEST BLOOD SUGAR ONCE D BEFORE BREAKFAST 04/10/17   [provider]  albuterol (PROVENTIL HFA;VENTOLIN HFA) 108 (90 Base) MCG/ACT inhaler Inhale 1 puff into the lungs every 6 (six) hours as needed for wheezing or shortness of breath. 06/22/18   Nche, Charlene Brooke, NP  amLODipine (NORVASC) 5 MG tablet TAKE 1 TABLET(5 MG) BY MOUTH DAILY 07/23/21   Shelda Pal, DO  atorvastatin (LIPITOR) 40 MG tablet TAKE 1 TABLET(40 MG) BY MOUTH DAILY 07/23/21   Wendling, Crosby Oyster, DO  blood glucose meter kit and supplies KIT  Dispense based on patient and insurance preference. Use once a day (before breakfast) as directed. 03/10/17   Nche, Charlene Brooke, NP  fluticasone (FLOVENT HFA) 110 MCG/ACT inhaler Inhale 2 puffs into the lungs in the morning and at bedtime. 07/03/20   Shelda Pal, DO  glimepiride (AMARYL) 4 MG tablet TAKE 1 TABLET(4 MG) BY MOUTH DAILY WITH BREAKFAST 07/23/21   Wendling, Crosby Oyster, DO  hydrochlorothiazide (HYDRODIURIL) 25 MG tablet TAKE 1 TABLET(25 MG) BY MOUTH DAILY 07/23/21   Wendling, Crosby Oyster, DO  Lancets Misc. (UNISTIK 3) MISC 1 Units by Does not apply route 2 (two) times daily before a meal. Low flow, I.61m, 28G 06/09/17   Nche, CCharlene Brooke NP  levothyroxine (SYNTHROID) 150 MCG tablet TAKE 1 TABLET BY MOUTH DAILY BEFORE BREAKFAST. 01/18/21   WShelda Pal DO  lisinopril (ZESTRIL) 20 MG tablet TAKE 1 TABLET(20 MG) BY MOUTH TWICE DAILY 06/14/21   Wendling, NCrosby Oyster DO  Omega-3 Fatty Acids (FISH OIL PO) Take by mouth.    [provider]  potassium chloride (MICRO-K) 10 MEQ CR capsule TAKE 2 CAPSULES(20 MEQ) BY MOUTH TWICE DAILY 01/25/21   WShelda Pal DO  PREVIDENT 0.2 % SOLN Take 10 mLs by mouth at bedtime. 03/12/20   [provider]  VITAMIN D PO Take by mouth.    [provider]  VITAMIN E PO Take by mouth.    [provider]    Allergies    Levaquin [levofloxacin in d5w] and Penicillins  Review of Systems   Review of Systems  Constitutional:  Negative for chills, diaphoresis and fever.  Respiratory:  Negative for cough and shortness of breath.   Cardiovascular:  Negative for chest pain, palpitations and leg swelling.  Gastrointestinal:  Negative for nausea and vomiting.  Musculoskeletal:  Positive for arthralgias. Negative for neck pain and neck stiffness.  All other systems reviewed and are negative.  Physical Exam Updated Vital Signs BP (!) 141/71 (BP Location: Left Arm)    Pulse 72    Temp 99 F  (37.2 C) (Oral)    Resp 15    SpO2 98%   Physical Exam Vitals and nursing note reviewed.  Constitutional:      Appearance: She is not ill-appearing or diaphoretic.  HENT:     Head: Normocephalic and atraumatic.  Eyes:  Conjunctiva/sclera: Conjunctivae normal.  Neck:     Comments: No C midline spinal TTP. Mild right trapezius TTP. Negative Spurling's test.  Cardiovascular:     Rate and Rhythm: Normal rate and regular rhythm.     Pulses: Normal pulses.  Pulmonary:     Effort: Pulmonary effort is normal.     Breath sounds: Normal breath sounds. No wheezing, rhonchi or rales.  Chest:     Chest wall: No tenderness.  Abdominal:     Palpations: Abdomen is soft.     Tenderness: There is no abdominal tenderness.  Musculoskeletal:     Cervical back: Neck supple. No tenderness.     Comments: No overlying skin changes to R shoulder including erythema, edema, or ecchymosis. + TTP to R shoulder joint. ROM limited s/2 pain. Pain illicited with external rotation and extension of R shoulder. Grip strength 5/5 bilaterally. Sensation intact throughout. 2+ radial pulse.   Skin:    General: Skin is warm and dry.  Neurological:     General: No focal deficit present.     Mental Status: She is alert and oriented to person, place, and time.     Cranial Nerves: No cranial nerve deficit.     Motor: No weakness.    ED Results / Procedures / Treatments   Labs (all labs ordered are listed, but only abnormal results are displayed) Labs Reviewed  BASIC METABOLIC PANEL - Abnormal; Notable for the following components:      Result Value   Potassium 2.9 (*)    Glucose, Bld 121 (*)    Creatinine, Ser 1.11 (*)    GFR, Estimated 59 (*)    All other components within normal limits  CBC - Abnormal; Notable for the following components:   RDW 15.7 (*)    All other components within normal limits  I-STAT BETA HCG BLOOD, ED ([facility], [facility], [facility] ONLY)  TROPONIN I (HIGH SENSITIVITY)    EKG None  Radiology DG  Chest 2 View  Result Date: 09/18/2021 CLINICAL DATA:  Chest pain EXAM: CHEST - 2 VIEW COMPARISON:  12/21/2018 FINDINGS: Normal heart size and mediastinal contours. No acute infiltrate or edema. No effusion or pneumothorax. No acute osseous findings. IMPRESSION: No active cardiopulmonary disease. Electronically Signed   By: Jorje Guild M.D.   On: 09/18/2021 05:03   DG Shoulder Right  Result Date: 09/18/2021 CLINICAL DATA:  Atraumatic right shoulder EXAM: RIGHT SHOULDER - 2+ VIEW COMPARISON:  None. FINDINGS: Degenerative acromioclavicular spurring. No acute fracture subluxation. On the frontal view there is hazy calcific density near the greater tuberosity, not seen on other projections likely due to overlap. IMPRESSION: Possible rotator cuff calcification, question symptoms of calcific tendinitis. Electronically Signed   By: Jorje Guild M.D.   On: 09/18/2021 07:46    Procedures Procedures   Medications Ordered in ED Medications  potassium chloride SA (KLOR-CON M) CR tablet 40 mEq (40 mEq Oral Not Given 09/18/21 0713)  ketorolac (TORADOL) injection 30 mg (30 mg Intramuscular Given 09/18/21 4709)    ED Course  I have reviewed the triage vital signs and the nursing notes.  Pertinent labs & imaging results that were available during my care of the patient were reviewed by me and considered in my medical decision making (see chart for details).    MDM Rules/Calculators/A&P                          55 year old female who presents to the ED  today with complaint of atraumatic right shoulder pain that began 2 days ago.  Pain elicited with movement.  On arrival to the ED vitals are stable.  Patient was triaged and ACS work-up started.  She does admit to me that she was concerned she was having a heart attack however denies any other symptoms including diaphoresis, shortness of breath, nausea, vomiting, specific chest pain.  EKG does show a right bundle branch block, I do not have previous to  compare to.  This x-ray is clear.  Troponin is 2.  Given pain has been ongoing for 2 days I do not feel she requires a repeat troponin at this time.  Incidentally her potassium is low at 2.9.  Per chart review she has been seen multiple times by PCP for hypokalemia.  She states she takes 60 mEq of potassium daily and they are unsure why she continues to have hypokalemia.  Will provide additional potassium supplementation here however patient will need to follow-up with PCP for further evaluation.  On my exam she is noted to have tenderness palpation most pacifically to the right shoulder joint with her limited range of motion.  Her pain does appear to be musculoskeletal in nature.  She has equal strength and sensation throughout.  I have very low suspicion for acute coronary syndrome.  She does not appear to have symptoms concerning for PE or dissection at this time.  We will plan for an x-ray of the right shoulder as well as Toradol IM for further evaluation.  There is concern for possible adhesive capsulitis of shoulder given hx of diabetes and hypothyroidism. Will reevaluate afterwards.  Pt currently declining potassium supplements. She takes 60 mEq daily and would like to go home and take it and have her potassium level rechecked by PCP. Given this is a chronic issue for patient I think this is a reasonable today.   Xray: IMPRESSION:  Possible rotator cuff calcification, question symptoms of calcific  tendinitis.   On reevaluation pt resting comfortably. Reports improvement after 30 mg IM Toradol. Will discharge home at this time with shoulder sling and ortho follow up. Pt instructed on RICE therapy and Ibuprofen/Tylenol as needed for pain. She is in agreement with plan and stable for discharge home.   This note was prepared using Dragon voice recognition software and may include unintentional dictation errors due to the inherent limitations of voice recognition software.       Final Clinical  Impression(s) / ED Diagnoses Final diagnoses:  Calcific tendinitis of right shoulder  Hypokalemia  Elevated serum creatinine  RBBB    Rx / DC Orders ED Discharge Orders     None        Discharge Instructions      Please follow up with Dr. Lucia Gaskins Orthopedist for further evaluation of your shoulder pain. I would recommend wearing the shoulder sling as needed for pain currently.   You can take Ibuprofen and Tylenol as needed for pain and apply either ice or heat (whichever helps more).   Please also follow up with your PCP. You will need to have both your kidney function (creatinine) and potassium level rechecked in 1-2 weeks. Please push fluids until you can have your kidney function rechecked and take your potassium supplements daily as indicated.   Your EKG did show an abnormality today however we do not have any previous ones to compare to. Follow up with your PCP regarding this.   Return to the ED for any new/worsening symptoms  Eustaquio Maize, PA-C 09/18/21 0820    Charlesetta Shanks, MD 09/18/21 [phone]

## 2021-09-18 NOTE — Discharge Instructions (Addendum)
Please follow up with Dr. Susa Simmonds Orthopedist for further evaluation of your shoulder pain. I would recommend wearing the shoulder sling as needed for pain currently.   You can take Ibuprofen and Tylenol as needed for pain and apply either ice or heat (whichever helps more).   Please also follow up with your PCP. You will need to have both your kidney function (creatinine) and potassium level rechecked in 1-2 weeks. Please push fluids until you can have your kidney function rechecked and take your potassium supplements daily as indicated.   Your EKG did show an abnormality today however we do not have any previous ones to compare to. Follow up with your PCP regarding this.   Return to the ED for any new/worsening symptoms

## 2021-09-18 NOTE — ED Triage Notes (Signed)
Pt states that she has been having L sided arm pain that goes up into her neck, no SOB, pain has been going on since yesterday

## 2021-09-20 ENCOUNTER — Telehealth: Payer: Self-pay | Admitting: Family Medicine

## 2021-09-20 DIAGNOSIS — I10 Essential (primary) hypertension: Secondary | ICD-10-CM

## 2021-09-20 MED ORDER — POTASSIUM CHLORIDE ER 10 MEQ PO CPCR: ORAL_CAPSULE | ORAL | 0 refills | Status: DC

## 2021-09-20 NOTE — Telephone Encounter (Signed)
Medication sent in. Called the patient left detailed message medication sent in////to call schedule ER F/U // do lab work.

## 2021-09-20 NOTE — Telephone Encounter (Signed)
The patient had labs done in the hospital and potassium was low.  She is in need of a refill on her potassium, but not sure if she needs to increase what she is taking?

## 2022-01-25 ENCOUNTER — Ambulatory Visit: Payer: Federal, State, Local not specified - PPO | Admitting: Family Medicine

## 2022-03-22 ENCOUNTER — Ambulatory Visit: Payer: Federal, State, Local not specified - PPO | Admitting: Family Medicine

## 2022-03-23 ENCOUNTER — Ambulatory Visit (INDEPENDENT_AMBULATORY_CARE_PROVIDER_SITE_OTHER): Payer: Federal, State, Local not specified - PPO | Admitting: Family Medicine

## 2022-03-23 ENCOUNTER — Encounter: Payer: Self-pay | Admitting: Family Medicine

## 2022-03-23 VITALS — BP 118/82 | HR 96 | Temp 98.0°F | Ht 68.0 in | Wt 245.3 lb

## 2022-03-23 DIAGNOSIS — E1165 Type 2 diabetes mellitus with hyperglycemia: Secondary | ICD-10-CM | POA: Diagnosis not present

## 2022-03-23 DIAGNOSIS — E039 Hypothyroidism, unspecified: Secondary | ICD-10-CM

## 2022-03-23 DIAGNOSIS — I10 Essential (primary) hypertension: Secondary | ICD-10-CM

## 2022-03-23 MED ORDER — LEVOTHYROXINE SODIUM 150 MCG PO TABS: 150.0000 ug | ORAL_TABLET | Freq: Every day | ORAL | 2 refills | Status: DC

## 2022-03-23 NOTE — Patient Instructions (Signed)
Give [place] 2-3 business days to get the results of your labs back.   Keep the diet clean and stay active.  Very strong work with your weight loss.   Monitor your blood pressures at home and if it drops <105/70 consistently, let me know.   Let us know if you need anything.

## 2022-03-23 NOTE — Progress Notes (Signed)
Subjective:   Chief Complaint  Patient presents with   Follow-up    Emily Zavala is a 56 y.o. female here for follow-up of diabetes.   Jezabelle does not monitor sugars at home.   Patient does not require insulin.   Medications include: Amaryl 4 mg/d Diet is much improved.  Exercise: Zumba  Hypertension Patient presents for hypertension follow up. She does not monitor home blood pressures. She is compliant with medications- Norvasc 5 mg/d, lisinopril 20 mg/d, HCTZ 25 mg/d. Patient has these side effects of medication: none Diet/exercise as above.  No CP or SOB.   Hypothyroidism Patient presents for follow-up of hypothyroidism.  Reports compliance with medication- levothyroxine 150 mcg/d. Current symptoms include: denies fatigue, weight changes, heat/cold intolerance, bowel/skin changes or CVS symptoms She believes her dose should be unchanged  Past Medical History:  Diagnosis Date   Allergy    yr around- sporatic episodes    Anemia    with fibroids    Arthritis    ankle    Asthma    stable without any medication.   Chronic pain of right ankle 08/22/2016   Diabetes mellitus without complication (HCC)    Hyperlipidemia    Hypertension    Thyroid disease      Related testing: Retinal exam: Done Pneumovax: done  Objective:  BP 118/82   Pulse 96   Temp 98 F (36.7 C) (Oral)   Ht 5\' 8"  (1.727 m)   Wt 245 lb 5 oz (111.3 kg)   SpO2 99%   BMI 37.30 kg/m  General:  Well developed, well nourished, in no apparent distress Lungs:  CTAB, no access msc use Cardio:  RRR, no bruits Psych: Age appropriate judgment and insight  Assessment:   Type 2 diabetes mellitus with hyperglycemia, without long-term current use of insulin (HCC)  Essential hypertension, benign  Hypothyroidism, unspecified type   Plan:   Chronic, stable. Cont Amaryl 4 mg/d, may end up stopping. Counseled on diet and exercise. Chronic, stable. Cont Norvasc 5 mg/d, HCTZ 25 mg/d, lisinopril  20 mg/d for now. Monitor BP at home as it may decrease as she continues to lose wt.  Chronic, stable. Cont levothyroxine 150 mcg/d.  F/u in 6 mo. The patient voiced understanding and agreement to the plan.  [place], DO 03/23/22 10:20 AM

## 2022-03-29 ENCOUNTER — Other Ambulatory Visit (INDEPENDENT_AMBULATORY_CARE_PROVIDER_SITE_OTHER): Payer: Federal, State, Local not specified - PPO

## 2022-03-29 DIAGNOSIS — E1165 Type 2 diabetes mellitus with hyperglycemia: Secondary | ICD-10-CM | POA: Diagnosis not present

## 2022-03-29 DIAGNOSIS — E039 Hypothyroidism, unspecified: Secondary | ICD-10-CM | POA: Diagnosis not present

## 2022-03-29 LAB — COMPREHENSIVE METABOLIC PANEL
ALT: 24 U/L (ref 0–35)
AST: 23 U/L (ref 0–37)
Albumin: 4.5 g/dL (ref 3.5–5.2)
Alkaline Phosphatase: 71 U/L (ref 39–117)
BUN: 18 mg/dL (ref 6–23)
CO2: 32 mEq/L (ref 19–32)
Calcium: 10.3 mg/dL (ref 8.4–10.5)
Chloride: 99 mEq/L (ref 96–112)
Creatinine, Ser: 0.91 mg/dL (ref 0.40–1.20)
GFR: 71.17 mL/min (ref >60.00)
Glucose, Bld: 100 mg/dL — ABNORMAL HIGH (ref 70–99)
Potassium: 3.6 mEq/L (ref 3.5–5.1)
Sodium: 139 mEq/L (ref 135–145)
Total Bilirubin: 0.6 mg/dL (ref 0.2–1.2)
Total Protein: 8 g/dL (ref 6.0–8.3)

## 2022-03-29 LAB — LIPID PANEL
Cholesterol: 193 mg/dL (ref 0–200)
HDL: 75.8 mg/dL (ref >39.00)
LDL Cholesterol: 102 mg/dL — ABNORMAL HIGH (ref 0–99)
NonHDL: 117.25
Total CHOL/HDL Ratio: 3
Triglycerides: 78 mg/dL (ref 0.0–149.0)
VLDL: 15.6 mg/dL (ref 0.0–40.0)

## 2022-03-29 LAB — TSH: TSH: 8.19 u[IU]/mL — ABNORMAL HIGH (ref 0.35–5.50)

## 2022-03-29 LAB — HEMOGLOBIN A1C: Hgb A1c MFr Bld: 6.1 % (ref 4.6–6.5)

## 2022-03-31 ENCOUNTER — Other Ambulatory Visit: Payer: Self-pay | Admitting: Family Medicine

## 2022-03-31 ENCOUNTER — Other Ambulatory Visit (INDEPENDENT_AMBULATORY_CARE_PROVIDER_SITE_OTHER): Payer: Federal, State, Local not specified - PPO

## 2022-03-31 DIAGNOSIS — E039 Hypothyroidism, unspecified: Secondary | ICD-10-CM

## 2022-03-31 LAB — T4, FREE: Free T4: 0.53 ng/dL — ABNORMAL LOW (ref 0.60–1.60)

## 2022-04-11 ENCOUNTER — Other Ambulatory Visit: Payer: Self-pay | Admitting: Family Medicine

## 2022-04-11 ENCOUNTER — Telehealth: Payer: Self-pay

## 2022-04-11 DIAGNOSIS — E039 Hypothyroidism, unspecified: Secondary | ICD-10-CM

## 2022-04-11 NOTE — Telephone Encounter (Signed)
OK. She can just come in for labs then to verify she is better again. OK to add back her glimepiride. Ty.

## 2022-04-11 NOTE — Telephone Encounter (Signed)
Called informed of verbal ok per PCP. Put in future order for thyroid labs to be done at the [place] office. Patient verbalized agreement//had no other questions or concerns.

## 2022-04-11 NOTE — Telephone Encounter (Signed)
Pt called regarding labs. I gave her her results but stated she wanted to talk to Robin. Pt states she was not taking thyroid medication at the time of her labs and states she had other concerns about her labs.

## 2022-04-11 NOTE — Telephone Encounter (Signed)
Order TSH and when do you want her to return for labs?

## 2022-04-11 NOTE — Telephone Encounter (Signed)
Spoke to the patient and she had 2 concerns: she was not able to message PCP on mychart.  1--She was out of thyroid medication at time of labs.. She is now taking it as it was refilled at her visit.  2--Regarding the glimepiride she would prefer to stay on it. Her goal is to get out of the 6 range of her hgba1c.

## 2022-05-04 ENCOUNTER — Encounter: Payer: Self-pay | Admitting: Family Medicine

## 2022-05-04 ENCOUNTER — Telehealth: Payer: Self-pay | Admitting: Family Medicine

## 2022-05-04 NOTE — Telephone Encounter (Signed)
Pt would like to speak with Zella Ball, she did not say why, stated it was regarding her last appt. Please advise.

## 2022-05-04 NOTE — Telephone Encounter (Signed)
The patient was seen in December at the ER as she thought she was having a heart attack. She was having palpitations. She would like a referral to a Cardiologist. She was informed she may need appt with PCP. Also she will either be sending through my chart or dropping off at the office a form from [facility] for a BP cuff.

## 2022-05-04 NOTE — Telephone Encounter (Signed)
Called informed the patient of PCP instructions. Scheduled appt with PCP on 05/06/22 .

## 2022-05-06 ENCOUNTER — Ambulatory Visit: Payer: Federal, State, Local not specified - PPO | Admitting: Family Medicine

## 2022-05-06 ENCOUNTER — Encounter: Payer: Self-pay | Admitting: Family Medicine

## 2022-05-06 VITALS — BP 132/86 | HR 78 | Temp 98.7°F | Ht 68.0 in | Wt 251.0 lb

## 2022-05-06 DIAGNOSIS — R002 Palpitations: Secondary | ICD-10-CM

## 2022-05-06 NOTE — Patient Instructions (Signed)
Please stay hydrated.  Mind your caffeine intake.   Your EKG is reassuring.   Let us know if you need anything.

## 2022-05-06 NOTE — Progress Notes (Signed)
Chief Complaint  Patient presents with   Palpitations    Emily Zavala is a 56 y.o. female here for palpitations.  Length of issue: 4 days How long does it last: a few seconds Light headedness/passing out? No Chest pain/shortness of breath? No Hx of arrhythmia? No Medication changes/illicit substances? No No alcohol.  She did drink a cola prior to this episode.  Could do better w hydration.   Past Medical History:  Diagnosis Date   Allergy    yr around- sporatic episodes    Anemia    with fibroids    Arthritis    ankle    Asthma    stable without any medication.   Chronic pain of right ankle 08/22/2016   Diabetes mellitus without complication ([place])    Hyperlipidemia    Hypertension    Thyroid disease    Past Surgical History:  Procedure Laterality Date   ABDOMINAL HYSTERECTOMY     uterus and cervix removed, still has ovaries, 01/2010   FOOT SURGERY Right 10/18/2012   plate and screws    MYOMECTOMY ABDOMINAL APPROACH     10/1998   TONSILLECTOMY     1983   Allergies  Allergen Reactions   Levaquin [Levofloxacin In D5w] Hives    Upset stomach    Penicillins Hives   Allergies as of 05/06/2022       Reactions   Levaquin [levofloxacin In D5w] Hives   Upset stomach    Penicillins Hives        Medication List        Accurate as of May 06, 2022  2:28 PM. If you have any questions, ask your nurse or doctor.          Accu-Chek Aviva Plus test strip Generic drug: glucose blood USE TO TEST BLOOD SUGAR ONCE D BEFORE BREAKFAST   albuterol 108 (90 Base) MCG/ACT inhaler Commonly known as: VENTOLIN HFA Inhale 1 puff into the lungs every 6 (six) hours as needed for wheezing or shortness of breath.   amLODipine 5 MG tablet Commonly known as: NORVASC TAKE 1 TABLET(5 MG) BY MOUTH DAILY   atorvastatin 40 MG tablet Commonly known as: LIPITOR TAKE 1 TABLET(40 MG) BY MOUTH DAILY   blood glucose meter kit and supplies Kit Dispense based on patient and  insurance preference. Use once a day (before breakfast) as directed.   FISH OIL PO Take by mouth.   Flovent HFA 110 MCG/ACT inhaler Generic drug: fluticasone Inhale 2 puffs into the lungs in the morning and at bedtime.   glimepiride 4 MG tablet Commonly known as: AMARYL TAKE 1 TABLET(4 MG) BY MOUTH DAILY WITH BREAKFAST   hydrochlorothiazide 25 MG tablet Commonly known as: HYDRODIURIL TAKE 1 TABLET(25 MG) BY MOUTH DAILY   levothyroxine 150 MCG tablet Commonly known as: SYNTHROID Take 1 tablet (150 mcg total) by mouth daily before breakfast.   lisinopril 20 MG tablet Commonly known as: ZESTRIL TAKE 1 TABLET(20 MG) BY MOUTH TWICE DAILY   potassium chloride 10 MEQ CR capsule Commonly known as: MICRO-K TAKE 2 CAPSULES(20 MEQ) BY MOUTH TWICE DAILY   PreviDent 0.2 % Soln Generic drug: SODIUM FLUORIDE (DENTAL RINSE) Take 10 mLs by mouth at bedtime.   Unistik 3 Misc 1 Units by Does not apply route 2 (two) times daily before a meal. Low flow, I.39m, 28G   VITAMIN D PO Take by mouth.   VITAMIN E PO Take by mouth.        BP 132/86  Pulse 78   Temp 98.7 F (37.1 C) (Oral)   Ht _0  (1.727 m)   Wt 251 lb (113.9 kg)   SpO2 97%   BMI 38.16 kg/m  Gen: Awake, alert, appearing stated age Eyes: PERRLA Mouth: MMM Heart: RRR, no bruits, no LE edema Lungs: CTAB, no accessory muscle use Neuro: No cerebellar signs MSK: No muscle group atrophy or asymmetry Psych: Age appropriate judgment and insight, mood/affect WNL  Palpitations - Plan: EKG 12-Lead  EKG shows NSR, normal axis, white QRS in R leads; otherwise no interval abnormalities, no ST segment or T wave changes, good R wave progression. Stay hydrated. Mind caffeine intake. I think the cola is the culprit given lack of s/s's since then.  The patient voiced understanding and agreement to the plan.  I spent 25 min w pt discussing the above plan in addition to reviewing her chart on the same day of the visit.    Crosby Oyster Navi [place] 2:28 PM 05/06/22

## 2022-05-10 DIAGNOSIS — H40013 Open angle with borderline findings, low risk, bilateral: Secondary | ICD-10-CM | POA: Diagnosis not present

## 2022-05-10 DIAGNOSIS — H524 Presbyopia: Secondary | ICD-10-CM | POA: Diagnosis not present

## 2022-05-10 DIAGNOSIS — E119 Type 2 diabetes mellitus without complications: Secondary | ICD-10-CM | POA: Diagnosis not present

## 2022-06-10 ENCOUNTER — Other Ambulatory Visit: Payer: Self-pay | Admitting: Family Medicine

## 2022-06-10 DIAGNOSIS — I10 Essential (primary) hypertension: Secondary | ICD-10-CM

## 2022-06-13 ENCOUNTER — Other Ambulatory Visit: Payer: Self-pay | Admitting: Family Medicine

## 2022-06-13 DIAGNOSIS — Z1231 Encounter for screening mammogram for malignant neoplasm of breast: Secondary | ICD-10-CM

## 2022-06-17 ENCOUNTER — Ambulatory Visit
Admission: RE | Admit: 2022-06-17 | Discharge: 2022-06-17 | Disposition: A | Discharge status: Home / Self Care | Payer: Federal, State, Local not specified - PPO | Source: Ambulatory Visit | Attending: Family Medicine | Admitting: Family Medicine

## 2022-06-17 DIAGNOSIS — Z1231 Encounter for screening mammogram for malignant neoplasm of breast: Secondary | ICD-10-CM | POA: Diagnosis not present

## 2022-06-28 ENCOUNTER — Other Ambulatory Visit: Payer: Self-pay | Admitting: Family Medicine

## 2022-06-28 MED ORDER — GLIMEPIRIDE 4 MG PO TABS: ORAL_TABLET | ORAL | 2 refills | Status: DC

## 2022-07-29 ENCOUNTER — Other Ambulatory Visit: Payer: Self-pay | Admitting: Family Medicine

## 2022-07-29 DIAGNOSIS — E782 Mixed hyperlipidemia: Secondary | ICD-10-CM

## 2022-07-29 MED ORDER — ATORVASTATIN CALCIUM 40 MG PO TABS: ORAL_TABLET | ORAL | 2 refills | Status: DC

## 2022-09-23 ENCOUNTER — Ambulatory Visit (INDEPENDENT_AMBULATORY_CARE_PROVIDER_SITE_OTHER): Payer: Federal, State, Local not specified - PPO | Admitting: Family Medicine

## 2022-09-23 ENCOUNTER — Encounter: Payer: Self-pay | Admitting: Family Medicine

## 2022-09-23 VITALS — BP 132/72 | HR 80 | Temp 98.7°F | Ht 68.0 in | Wt 257.0 lb

## 2022-09-23 DIAGNOSIS — E039 Hypothyroidism, unspecified: Secondary | ICD-10-CM

## 2022-09-23 DIAGNOSIS — E1165 Type 2 diabetes mellitus with hyperglycemia: Secondary | ICD-10-CM | POA: Diagnosis not present

## 2022-09-23 DIAGNOSIS — Z0000 Encounter for general adult medical examination without abnormal findings: Secondary | ICD-10-CM | POA: Diagnosis not present

## 2022-09-23 DIAGNOSIS — J4521 Mild intermittent asthma with (acute) exacerbation: Secondary | ICD-10-CM

## 2022-09-23 MED ORDER — ALBUTEROL SULFATE HFA 108 (90 BASE) MCG/ACT IN AERS: 1.0000 | INHALATION_SPRAY | Freq: Four times a day (QID) | RESPIRATORY_TRACT | 2 refills | Status: DC | PRN

## 2022-09-23 NOTE — Addendum Note (Signed)
Addended by: Scharlene Gloss B on: 09/23/2022 11:13 AM   Modules accepted: Orders

## 2022-09-23 NOTE — Patient Instructions (Addendum)
Give [place] 2-3 business days to get the results of your labs back.   Keep the diet clean and stay active.  Please get me a copy of your advanced directive form at your convenience.   The Shingrix vaccine (for shingles) is a 2 shot series spaced 2-6 months apart. It can make people feel low energy, achy and almost like they have the flu for 48 hours after injection. 1/5 people can have nausea and/or vomiting. Please plan accordingly when deciding on when to get this shot. Call our office or go to the pharmacy to get this. The second shot of the series is less severe regarding the side effects, but it still lasts 48 hours.   You can get the tetanus booster at the pharmacy as well.   Let us know if you need anything.

## 2022-09-23 NOTE — Progress Notes (Addendum)
Chief Complaint  Patient presents with   Follow-up    6 month Needs an Inhaler refilled      Well Woman Emily Zavala is here for a complete physical.   Her last physical was >1 year ago.  Current diet: in general, a "healthy" diet. Current exercise: Zumba, walking. Weight is increased due to swelling and she denies fatigue out of ordinary. Seatbelt? Yes Advanced directive? Yes  Health Maintenance Mammogram- Yes Colon cancer screening-Yes Shingrix- No Tetanus- Due Hep C screening- Yes HIV screening- Yes  Past Medical History:  Diagnosis Date   Allergy    yr around- sporatic episodes    Anemia    with fibroids    Arthritis    ankle    Asthma    stable without any medication.   Chronic pain of right ankle 08/22/2016   Diabetes mellitus without complication (HCC)    Hyperlipidemia    Hypertension    Thyroid disease      Past Surgical History:  Procedure Laterality Date   ABDOMINAL HYSTERECTOMY     uterus and cervix removed, still has ovaries, 01/2010   FOOT SURGERY Right 10/18/2012   plate and screws    MYOMECTOMY ABDOMINAL APPROACH     10/1998   TONSILLECTOMY     1983    Medications  Current Outpatient Medications on File Prior to Visit  Medication Sig Dispense Refill   ACCU-CHEK AVIVA PLUS test strip USE TO TEST BLOOD SUGAR ONCE D BEFORE BREAKFAST  1   amLODipine (NORVASC) 5 MG tablet TAKE 1 TABLET(5 MG) BY MOUTH DAILY 90 tablet 2   atorvastatin (LIPITOR) 40 MG tablet TAKE 1 TABLET(40 MG) BY MOUTH DAILY 90 tablet 2   blood glucose meter kit and supplies KIT Dispense based on patient and insurance preference. Use once a day (before breakfast) as directed. 1 each 1   fluticasone (FLOVENT HFA) 110 MCG/ACT inhaler Inhale 2 puffs into the lungs in the morning and at bedtime. 1 each 12   glimepiride (AMARYL) 4 MG tablet TAKE 1 TABLET(4 MG) BY MOUTH DAILY WITH BREAKFAST 90 tablet 2   hydrochlorothiazide (HYDRODIURIL) 25 MG tablet TAKE 1 TABLET(25 MG) BY  MOUTH DAILY 90 tablet 2   Lancets Misc. (UNISTIK 3) MISC 1 Units by Does not apply route 2 (two) times daily before a meal. Low flow, I.72m, 28G 100 each 11   levothyroxine (SYNTHROID) 150 MCG tablet Take 1 tablet (150 mcg total) by mouth daily before breakfast. 90 tablet 2   lisinopril (ZESTRIL) 20 MG tablet Take 1 tablet (20 mg total) by mouth in the morning and at bedtime. 180 tablet 1   Omega-3 Fatty Acids (FISH OIL PO) Take by mouth.     potassium chloride (MICRO-K) 10 MEQ CR capsule TAKE 2 CAPSULES(20 MEQ) BY MOUTH TWICE DAILY 360 capsule 0   PREVIDENT 0.2 % SOLN Take 10 mLs by mouth at bedtime.     VITAMIN D PO Take by mouth.     VITAMIN E PO Take by mouth.     No current facility-administered medications on file prior to visit.     Allergies Allergies  Allergen Reactions   Levaquin [Levofloxacin In D5w] Hives    Upset stomach    Penicillins Hives    Review of Systems: Constitutional:  no unexpected weight changes Eye:  no recent significant change in vision Ear/Nose/Mouth/Throat:  Ears:  no recent change in hearing Nose/Mouth/Throat:  no complaints of nasal congestion, no sore throat Cardiovascular: no chest  pain Respiratory:  no shortness of breath Gastrointestinal:  no abdominal pain, no change in bowel habits GU:  Female: negative for dysuria or pelvic pain Musculoskeletal/Extremities:  no pain of the joints Integumentary (Skin/Breast):  no abnormal skin lesions reported Neurologic:  no headaches Endocrine:  denies fatigue  Exam BP 132/72 (BP Location: Left Arm, Patient Position: Sitting, Cuff Size: Normal)   Pulse 80   Temp 98.7 F (37.1 C) (Oral)   Ht 5' 8" (1.727 m)   Wt 257 lb (116.6 kg)   SpO2 98%   BMI 39.08 kg/m  General:  well developed, well nourished, in no apparent distress Skin:  no significant moles, warts, or growths Head:  no masses, lesions, or tenderness Eyes:  pupils equal and round, sclera anicteric without injection Ears:  canals without  lesions, TMs shiny without retraction, no obvious effusion, no erythema Nose:  nares patent, mucosa normal, and no drainage  Throat/Pharynx:  lips and gingiva without lesion; tongue and uvula midline; non-inflamed pharynx; no exudates or postnasal drainage Neck: neck supple without adenopathy, thyromegaly, or masses Lungs:  clear to auscultation, breath sounds equal bilaterally, no respiratory distress Cardio:  regular rate and rhythm, no LE edema Abdomen:  abdomen soft, nontender; bowel sounds normal; no masses or organomegaly Genital: Defer to GYN Musculoskeletal:  symmetrical muscle groups noted without atrophy or deformity Extremities:  no clubbing, cyanosis, or edema, no deformities, no skin discoloration Neuro:  gait normal; deep tendon reflexes normal and symmetric Psych: well oriented with normal range of affect and appropriate judgment/insight  Assessment and Plan  Well adult exam - Plan: CBC, Comprehensive metabolic panel, Lipid panel  Type 2 diabetes mellitus with hyperglycemia, without long-term current use of insulin (HCC) - Plan: Hemoglobin A1c, Microalbumin / creatinine urine ratio  Hypothyroidism, unspecified type - Plan: T4, free, TSH  Mild intermittent asthma with acute exacerbation - Plan: albuterol (VENTOLIN HFA) 108 (90 Base) MCG/ACT inhaler   Well 56 y.o. female. Counseled on diet and exercise. Advanced directive form requested today.  Other orders as above.  She will get labs at the [facility] location. Follow up in 6 mo. The patient voiced understanding and agreement to the plan.  [place], DO 09/23/22 11:10 AM

## 2022-09-27 ENCOUNTER — Other Ambulatory Visit (INDEPENDENT_AMBULATORY_CARE_PROVIDER_SITE_OTHER): Payer: Federal, State, Local not specified - PPO

## 2022-09-27 ENCOUNTER — Other Ambulatory Visit: Payer: Self-pay

## 2022-09-27 DIAGNOSIS — E1165 Type 2 diabetes mellitus with hyperglycemia: Secondary | ICD-10-CM | POA: Diagnosis not present

## 2022-09-27 DIAGNOSIS — Z0000 Encounter for general adult medical examination without abnormal findings: Secondary | ICD-10-CM

## 2022-09-27 DIAGNOSIS — E039 Hypothyroidism, unspecified: Secondary | ICD-10-CM

## 2022-09-27 DIAGNOSIS — E876 Hypokalemia: Secondary | ICD-10-CM

## 2022-09-27 LAB — COMPREHENSIVE METABOLIC PANEL
ALT: 22 U/L (ref 0–35)
AST: 18 U/L (ref 0–37)
Albumin: 4.3 g/dL (ref 3.5–5.2)
Alkaline Phosphatase: 83 U/L (ref 39–117)
BUN: 14 mg/dL (ref 6–23)
CO2: 27 mEq/L (ref 19–32)
Calcium: 10.3 mg/dL (ref 8.4–10.5)
Chloride: 102 mEq/L (ref 96–112)
Creatinine, Ser: 1.01 mg/dL (ref 0.40–1.20)
GFR: 62.58 mL/min (ref >60.00)
Glucose, Bld: 118 mg/dL — ABNORMAL HIGH (ref 70–99)
Potassium: 3.1 mEq/L — ABNORMAL LOW (ref 3.5–5.1)
Sodium: 142 mEq/L (ref 135–145)
Total Bilirubin: 0.9 mg/dL (ref 0.2–1.2)
Total Protein: 7.9 g/dL (ref 6.0–8.3)

## 2022-09-27 LAB — CBC
HCT: 44.9 % (ref 36.0–46.0)
Hemoglobin: 14.7 g/dL (ref 12.0–15.0)
MCHC: 32.7 g/dL (ref 30.0–36.0)
MCV: 77.8 fl — ABNORMAL LOW (ref 78.0–100.0)
Platelets: 290 10*3/uL (ref 150.0–400.0)
RBC: 5.77 Mil/uL — ABNORMAL HIGH (ref 3.87–5.11)
RDW: 15.9 % — ABNORMAL HIGH (ref 11.5–15.5)
WBC: 4 10*3/uL (ref 4.0–10.5)

## 2022-09-27 LAB — LIPID PANEL
Cholesterol: 184 mg/dL (ref 0–200)
HDL: 64.7 mg/dL (ref >39.00)
LDL Cholesterol: 100 mg/dL — ABNORMAL HIGH (ref 0–99)
NonHDL: 119.11
Total CHOL/HDL Ratio: 3
Triglycerides: 94 mg/dL (ref 0.0–149.0)
VLDL: 18.8 mg/dL (ref 0.0–40.0)

## 2022-09-27 LAB — T4, FREE: Free T4: 1.28 ng/dL (ref 0.60–1.60)

## 2022-09-27 LAB — HEMOGLOBIN A1C: Hgb A1c MFr Bld: 6.2 % (ref 4.6–6.5)

## 2022-09-27 LAB — TSH: TSH: 0.01 u[IU]/mL — ABNORMAL LOW (ref 0.35–5.50)

## 2022-09-29 NOTE — Addendum Note (Signed)
Addended byConrad Cumberland D on: 09/29/2022 11:52 AM   Modules accepted: Orders

## 2022-10-04 ENCOUNTER — Other Ambulatory Visit: Payer: Self-pay | Admitting: Family Medicine

## 2022-10-04 DIAGNOSIS — I10 Essential (primary) hypertension: Secondary | ICD-10-CM

## 2022-10-04 MED ORDER — AMLODIPINE BESYLATE 5 MG PO TABS: ORAL_TABLET | ORAL | 2 refills | Status: DC

## 2022-10-04 MED ORDER — HYDROCHLOROTHIAZIDE 25 MG PO TABS: ORAL_TABLET | ORAL | 2 refills | Status: DC

## 2022-10-04 MED ORDER — LISINOPRIL 20 MG PO TABS: 20.0000 mg | ORAL_TABLET | Freq: Two times a day (BID) | ORAL | 1 refills | Status: DC

## 2023-03-17 ENCOUNTER — Other Ambulatory Visit: Payer: Self-pay | Admitting: Family Medicine

## 2023-03-17 DIAGNOSIS — E782 Mixed hyperlipidemia: Secondary | ICD-10-CM

## 2023-03-17 DIAGNOSIS — I10 Essential (primary) hypertension: Secondary | ICD-10-CM

## 2023-03-17 DIAGNOSIS — E039 Hypothyroidism, unspecified: Secondary | ICD-10-CM

## 2023-03-17 MED ORDER — GLIMEPIRIDE 4 MG PO TABS: ORAL_TABLET | ORAL | 2 refills | Status: DC

## 2023-03-17 MED ORDER — ATORVASTATIN CALCIUM 40 MG PO TABS: ORAL_TABLET | ORAL | 2 refills | Status: DC

## 2023-03-24 ENCOUNTER — Ambulatory Visit: Payer: Federal, State, Local not specified - PPO | Admitting: Family Medicine

## 2023-03-27 ENCOUNTER — Telehealth: Payer: Self-pay | Admitting: Family Medicine

## 2023-03-27 DIAGNOSIS — E1165 Type 2 diabetes mellitus with hyperglycemia: Secondary | ICD-10-CM

## 2023-03-27 DIAGNOSIS — E039 Hypothyroidism, unspecified: Secondary | ICD-10-CM

## 2023-03-27 NOTE — Telephone Encounter (Signed)
Pt would like to come in and get labs done prior to her appt on Friday.

## 2023-03-27 NOTE — Telephone Encounter (Signed)
OK to sched. Orders placed. Ty.

## 2023-03-27 NOTE — Telephone Encounter (Signed)
Called and scheduled lab appt

## 2023-03-28 ENCOUNTER — Other Ambulatory Visit (INDEPENDENT_AMBULATORY_CARE_PROVIDER_SITE_OTHER): Payer: BLUE CROSS/BLUE SHIELD

## 2023-03-28 DIAGNOSIS — E039 Hypothyroidism, unspecified: Secondary | ICD-10-CM | POA: Diagnosis not present

## 2023-03-28 DIAGNOSIS — E1165 Type 2 diabetes mellitus with hyperglycemia: Secondary | ICD-10-CM

## 2023-03-28 DIAGNOSIS — E876 Hypokalemia: Secondary | ICD-10-CM | POA: Diagnosis not present

## 2023-03-28 LAB — COMPREHENSIVE METABOLIC PANEL WITH GFR
ALT: 23 U/L (ref 0–35)
AST: 19 U/L (ref 0–37)
Albumin: 4.2 g/dL (ref 3.5–5.2)
Alkaline Phosphatase: 83 U/L (ref 39–117)
BUN: 16 mg/dL (ref 6–23)
CO2: 32 meq/L (ref 19–32)
Calcium: 10 mg/dL (ref 8.4–10.5)
Chloride: 99 meq/L (ref 96–112)
Creatinine, Ser: 0.84 mg/dL (ref 0.40–1.20)
GFR: 77.8 mL/min
Glucose, Bld: 110 mg/dL — ABNORMAL HIGH (ref 70–99)
Potassium: 3.5 meq/L (ref 3.5–5.1)
Sodium: 140 meq/L (ref 135–145)
Total Bilirubin: 0.8 mg/dL (ref 0.2–1.2)
Total Protein: 7 g/dL (ref 6.0–8.3)

## 2023-03-28 LAB — LIPID PANEL
Cholesterol: 166 mg/dL (ref 0–200)
HDL: 63.8 mg/dL (ref >39.00)
LDL Cholesterol: 85 mg/dL (ref 0–99)
NonHDL: 102.18
Total CHOL/HDL Ratio: 3
Triglycerides: 88 mg/dL (ref 0.0–149.0)
VLDL: 17.6 mg/dL (ref 0.0–40.0)

## 2023-03-28 LAB — HEMOGLOBIN A1C: Hgb A1c MFr Bld: 6.2 % (ref 4.6–6.5)

## 2023-03-28 LAB — TSH: TSH: 0.4 u[IU]/mL (ref 0.35–5.50)

## 2023-03-28 LAB — T4, FREE: Free T4: 0.76 ng/dL (ref 0.60–1.60)

## 2023-03-28 LAB — MAGNESIUM: Magnesium: 2 mg/dL (ref 1.5–2.5)

## 2023-03-28 NOTE — Addendum Note (Signed)
Addended by: Mervin Kung A on: 03/28/2023 03:31 PM   Modules accepted: Orders

## 2023-03-29 ENCOUNTER — Other Ambulatory Visit (INDEPENDENT_AMBULATORY_CARE_PROVIDER_SITE_OTHER): Payer: BLUE CROSS/BLUE SHIELD

## 2023-03-29 DIAGNOSIS — E1165 Type 2 diabetes mellitus with hyperglycemia: Secondary | ICD-10-CM | POA: Diagnosis not present

## 2023-03-29 LAB — MICROALBUMIN / CREATININE URINE RATIO
Creatinine,U: 80.8 mg/dL
Microalb Creat Ratio: 0.9 mg/g (ref 0.0–30.0)
Microalb, Ur: <0.7 mg/dL (ref 0.0–1.9)

## 2023-03-31 ENCOUNTER — Encounter: Payer: Self-pay | Admitting: Family Medicine

## 2023-03-31 ENCOUNTER — Ambulatory Visit: Payer: BLUE CROSS/BLUE SHIELD | Admitting: Family Medicine

## 2023-03-31 ENCOUNTER — Ambulatory Visit (INDEPENDENT_AMBULATORY_CARE_PROVIDER_SITE_OTHER): Payer: BLUE CROSS/BLUE SHIELD | Admitting: Family Medicine

## 2023-03-31 VITALS — BP 130/78 | HR 68 | Temp 99.0°F | Ht 68.0 in | Wt 263.5 lb

## 2023-03-31 DIAGNOSIS — I10 Essential (primary) hypertension: Secondary | ICD-10-CM | POA: Diagnosis not present

## 2023-03-31 DIAGNOSIS — E1165 Type 2 diabetes mellitus with hyperglycemia: Secondary | ICD-10-CM | POA: Diagnosis not present

## 2023-03-31 DIAGNOSIS — E039 Hypothyroidism, unspecified: Secondary | ICD-10-CM | POA: Diagnosis not present

## 2023-03-31 DIAGNOSIS — Z7984 Long term (current) use of oral hypoglycemic drugs: Secondary | ICD-10-CM

## 2023-03-31 NOTE — Progress Notes (Signed)
Subjective:   Chief Complaint  Patient presents with   Follow-up    6 month   Emily Zavala is a 57 y.o. female here for follow-up of diabetes.   Emily Zavala does not routinely check her sugars.  Patient does not require insulin.   Medications include: Amaryl 4 mg/d Diet is improving.  Exercise: walking  Hypertension Patient presents for hypertension follow up. She does not monitor home blood pressures. She is compliant with medications- Norvasc 5 mg/d, lisinopril 20 mg/d, hydrochlorothiazide 25 mg/d Patient has these side effects of medication: none Diet/exercise as above. No CP or SOB.   Hypothyroidism Patient presents for follow-up of hypothyroidism.  Reports compliance with medication- levothyroxine 150 mcg/d. Current symptoms include: denies fatigue, weight changes, heat/cold intolerance, bowel/skin changes or CVS symptoms She believes her dose should be not significantly changed  Past Medical History:  Diagnosis Date   Allergy    yr around- sporatic episodes    Anemia    with fibroids    Arthritis    ankle    Asthma    stable without any medication.   Chronic pain of right ankle 08/22/2016   Diabetes mellitus without complication (HCC)    Hyperlipidemia    Hypertension    Thyroid disease      Related testing: Retinal exam: Done Pneumovax: done  Objective:  BP 130/78 (BP Location: Left Arm, Cuff Size: Large)   Pulse 68   Temp 99 F (37.2 C) (Oral)   Ht 5\' 8"  (1.727 m)   Wt 263 lb 8 oz (119.5 kg)   SpO2 98%   BMI 40.07 kg/m  General:  Well developed, well nourished, in no apparent distress Lungs:  CTAB, no access msc use Cardio:  RRR, no bruits, 2+ pitting b/l LE edema around ankles tapering at distal 1/3 of tibia Psych: Age appropriate judgment and insight  Assessment:   Type 2 diabetes mellitus with hyperglycemia, without long-term current use of insulin (HCC)  Essential hypertension, benign  Hypothyroidism, unspecified type   Plan:    Chronic, stable. Cont Amaryl 4 mg/d. Counseled on diet and exercise. Chronic, stable. Cont Norvasc 5 mg/d, hydrochlorothiazide 25 mg/d, lisinopril 20 mg/d.  Chronic, stable. Cont levothyroxine 150 mcg/d.  F/u in 6 mo. The patient voiced understanding and agreement to the plan.  Jilda Roche [place], DO 03/31/23 10:50 AM

## 2023-03-31 NOTE — Patient Instructions (Addendum)
Keep up the good work.   Keep the diet clean and stay active.  There are weekly shots we can use to help lose weight to replace the Amaryl with if you ever plateau with the weight loss efforts.   Let us know if you need anything.

## 2023-06-01 ENCOUNTER — Other Ambulatory Visit: Payer: Self-pay | Admitting: Family Medicine

## 2023-06-01 DIAGNOSIS — Z1231 Encounter for screening mammogram for malignant neoplasm of breast: Secondary | ICD-10-CM

## 2023-06-23 ENCOUNTER — Ambulatory Visit
Admission: RE | Admit: 2023-06-23 | Discharge: 2023-06-23 | Disposition: A | Discharge status: Home / Self Care | Payer: Federal, State, Local not specified - PPO | Source: Ambulatory Visit

## 2023-06-23 DIAGNOSIS — Z1231 Encounter for screening mammogram for malignant neoplasm of breast: Secondary | ICD-10-CM

## 2023-06-26 ENCOUNTER — Other Ambulatory Visit: Payer: Self-pay | Admitting: Family Medicine

## 2023-06-26 DIAGNOSIS — I10 Essential (primary) hypertension: Secondary | ICD-10-CM

## 2023-08-19 DIAGNOSIS — E119 Type 2 diabetes mellitus without complications: Secondary | ICD-10-CM | POA: Diagnosis not present

## 2023-08-19 LAB — HM DIABETES EYE EXAM

## 2023-08-30 NOTE — Telephone Encounter (Signed)
Abstracted by HIM 08/19/2023. HM updated

## 2023-09-03 ENCOUNTER — Other Ambulatory Visit: Payer: Self-pay | Admitting: Family Medicine

## 2023-09-03 DIAGNOSIS — J4521 Mild intermittent asthma with (acute) exacerbation: Secondary | ICD-10-CM

## 2023-10-02 ENCOUNTER — Encounter: Payer: BLUE CROSS/BLUE SHIELD | Admitting: Family Medicine

## 2023-10-12 ENCOUNTER — Other Ambulatory Visit (INDEPENDENT_AMBULATORY_CARE_PROVIDER_SITE_OTHER): Payer: Federal, State, Local not specified - PPO

## 2023-10-12 DIAGNOSIS — E039 Hypothyroidism, unspecified: Secondary | ICD-10-CM | POA: Diagnosis not present

## 2023-10-12 DIAGNOSIS — E1165 Type 2 diabetes mellitus with hyperglycemia: Secondary | ICD-10-CM

## 2023-10-13 LAB — COMPREHENSIVE METABOLIC PANEL WITH GFR
ALT: 22 U/L (ref 0–35)
AST: 22 U/L (ref 0–37)
Albumin: 4.5 g/dL (ref 3.5–5.2)
Alkaline Phosphatase: 88 U/L (ref 39–117)
BUN: 18 mg/dL (ref 6–23)
CO2: 34 meq/L — ABNORMAL HIGH (ref 19–32)
Calcium: 9.7 mg/dL (ref 8.4–10.5)
Chloride: 99 meq/L (ref 96–112)
Creatinine, Ser: 0.96 mg/dL (ref 0.40–1.20)
GFR: 66.03 mL/min
Glucose, Bld: 102 mg/dL — ABNORMAL HIGH (ref 70–99)
Potassium: 3.4 meq/L — ABNORMAL LOW (ref 3.5–5.1)
Sodium: 144 meq/L (ref 135–145)
Total Bilirubin: 0.7 mg/dL (ref 0.2–1.2)
Total Protein: 7.3 g/dL (ref 6.0–8.3)

## 2023-10-13 LAB — T4, FREE: Free T4: 0.66 ng/dL (ref 0.60–1.60)

## 2023-10-13 LAB — CBC
HCT: 43.5 % (ref 36.0–46.0)
Hemoglobin: 13.9 g/dL (ref 12.0–15.0)
MCHC: 32 g/dL (ref 30.0–36.0)
MCV: 79.1 fl (ref 78.0–100.0)
Platelets: 318 K/uL (ref 150.0–400.0)
RBC: 5.5 Mil/uL — ABNORMAL HIGH (ref 3.87–5.11)
RDW: 16.5 % — ABNORMAL HIGH (ref 11.5–15.5)
WBC: 4.8 K/uL (ref 4.0–10.5)

## 2023-10-13 LAB — LIPID PANEL
Cholesterol: 213 mg/dL — ABNORMAL HIGH (ref 0–200)
HDL: 76.5 mg/dL
LDL Cholesterol: 119 mg/dL — ABNORMAL HIGH (ref 0–99)
NonHDL: 136.9
Total CHOL/HDL Ratio: 3
Triglycerides: 88 mg/dL (ref 0.0–149.0)
VLDL: 17.6 mg/dL (ref 0.0–40.0)

## 2023-10-13 LAB — HEMOGLOBIN A1C: Hgb A1c MFr Bld: 6.6 % — ABNORMAL HIGH (ref 4.6–6.5)

## 2023-10-13 LAB — TSH: TSH: 5.57 u[IU]/mL — ABNORMAL HIGH (ref 0.35–5.50)

## 2023-10-18 ENCOUNTER — Ambulatory Visit: Payer: Federal, State, Local not specified - PPO | Admitting: Family Medicine

## 2023-10-18 ENCOUNTER — Encounter: Payer: Self-pay | Admitting: Family Medicine

## 2023-10-18 VITALS — BP 132/82 | HR 92 | Temp 98.0°F | Resp 16 | Ht 68.0 in | Wt 260.0 lb

## 2023-10-18 DIAGNOSIS — E876 Hypokalemia: Secondary | ICD-10-CM | POA: Diagnosis not present

## 2023-10-18 DIAGNOSIS — Z0000 Encounter for general adult medical examination without abnormal findings: Secondary | ICD-10-CM

## 2023-10-18 NOTE — Patient Instructions (Addendum)
 Keep the diet clean and stay active.  Get back on the wagon with your physical activity.   Aim to do some physical exertion for 150 minutes per week. This is typically divided into 5 days per week, 30 minutes per day. The activity should be enough to get your heart rate up. Anything is better than nothing if you have time constraints.  Please get me a copy of your advanced directive form at your convenience.   Please consider getting the pneumonia vaccine.   Let us  know if you need anything.

## 2023-10-18 NOTE — Progress Notes (Signed)
 Chief Complaint  Patient presents with   Annual Exam    Annual Exam     Well Woman Emily Zavala is here for a complete physical.   Her last physical was >1 year ago.  Current diet: in general, diet could be better Current exercise: none. Weight is increased and she denies fatigue out of ordinary. Seatbelt? Yes Advanced directive? No  Health Maintenance Pap/HPV- N/A Mammogram- Yes Colon cancer screening-Yes Shingrix- No Tetanus- No Hep C screening- Yes HIV screening- Yes  Past Medical History:  Diagnosis Date   Allergy    yr around- sporatic episodes    Anemia    with fibroids    Arthritis    ankle    Asthma    stable without any medication.   Chronic pain of right ankle 08/22/2016   Diabetes mellitus without complication (HCC)    Hyperlipidemia    Hypertension    Thyroid  disease      Past Surgical History:  Procedure Laterality Date   ABDOMINAL HYSTERECTOMY     uterus and cervix removed, still has ovaries, 01/2010   FOOT SURGERY Right 10/18/2012   plate and screws    MYOMECTOMY ABDOMINAL APPROACH     10/1998   TONSILLECTOMY     1983    Medications  Current Outpatient Medications on File Prior to Visit  Medication Sig Dispense Refill   ACCU-CHEK AVIVA PLUS test strip USE TO TEST BLOOD SUGAR ONCE D BEFORE BREAKFAST  1   albuterol  (VENTOLIN  HFA) 108 (90 Base) MCG/ACT inhaler Inhale 1 puff into the lungs every 6 (six) hours as needed for wheezing or shortness of breath. 6.7 g 1   amLODipine  (NORVASC ) 5 MG tablet TAKE 1 TABLET(5 MG) BY MOUTH DAILY 90 tablet 2   atorvastatin  (LIPITOR) 40 MG tablet TAKE 1 TABLET(40 MG) BY MOUTH DAILY 90 tablet 2   blood glucose meter kit and supplies KIT Dispense based on patient and insurance preference. Use once a day (before breakfast) as directed. 1 each 1   fluticasone  (FLOVENT  HFA) 110 MCG/ACT inhaler Inhale 2 puffs into the lungs in the morning and at bedtime. 1 each 12   glimepiride  (AMARYL ) 4 MG tablet TAKE 1  TABLET(4 MG) BY MOUTH DAILY WITH BREAKFAST 90 tablet 2   hydrochlorothiazide  (HYDRODIURIL ) 25 MG tablet TAKE 1 TABLET(25 MG) BY MOUTH DAILY 90 tablet 2   Lancets Misc. (UNISTIK 3) MISC 1 Units by Does not apply route 2 (two) times daily before a meal. Low flow, I.75mm, 28G 100 each 11   levothyroxine  (SYNTHROID ) 150 MCG tablet TAKE 1 TABLET(150 MCG) BY MOUTH DAILY BEFORE BREAKFAST 90 tablet 2   lisinopril  (ZESTRIL ) 20 MG tablet TAKE 1 TABLET(20 MG) BY MOUTH IN THE MORNING AND AT BEDTIME 180 tablet 1   Omega-3 Fatty Acids (FISH OIL PO) Take by mouth.     potassium chloride  (MICRO-K ) 10 MEQ CR capsule TAKE 2 CAPSULES(20 MEQ) BY MOUTH TWICE DAILY 360 capsule 0   PREVIDENT 0.2 % SOLN Take 10 mLs by mouth at bedtime.     VITAMIN D PO Take by mouth.     VITAMIN E PO Take by mouth.     No current facility-administered medications on file prior to visit.     Allergies Allergies  Allergen Reactions   Levaquin [Levofloxacin In D5w] Hives    Upset stomach    Penicillins Hives    Review of Systems: Constitutional:  no unexpected weight changes Eye:  no recent significant change in vision  Ear/Nose/Mouth/Throat:  Ears:  no recent change in hearing Nose/Mouth/Throat:  no complaints of nasal congestion, no sore throat Cardiovascular: no chest pain Respiratory:  no shortness of breath Gastrointestinal:  no abdominal pain, no change in bowel habits GU:  Female: negative for dysuria or pelvic pain Musculoskeletal/Extremities:  no pain of the joints Integumentary (Skin/Breast):  no abnormal skin lesions reported Neurologic:  no headaches Endocrine:  denies fatigue  Exam BP 132/82   Pulse 92   Temp 98 F (36.7 C) (Oral)   Resp 16   Ht 5\' 8"  (1.727 m)   Wt 260 lb (117.9 kg)   SpO2 98%   BMI 39.53 kg/m  General:  well developed, well nourished, in no apparent distress Skin:  no significant moles, warts, or growths Head:  no masses, lesions, or tenderness Eyes:  pupils equal and round,  sclera anicteric without injection Ears:  canals without lesions, TMs shiny without retraction, no obvious effusion, no erythema Nose:  nares patent, mucosa normal, and no drainage  Throat/Pharynx:  lips and gingiva without lesion; tongue and uvula midline; non-inflamed pharynx; no exudates or postnasal drainage Neck: neck supple without adenopathy, thyromegaly, or masses Lungs:  clear to auscultation, breath sounds equal bilaterally, no respiratory distress Cardio:  regular rate and rhythm, no LE edema Abdomen:  abdomen soft, nontender; bowel sounds normal; no masses or organomegaly Genital: Defer to GYN Musculoskeletal:  symmetrical muscle groups noted without atrophy or deformity Extremities:  no clubbing, cyanosis, or edema, no deformities, no skin discoloration Neuro:  gait normal; deep tendon reflexes normal and symmetric Psych: well oriented with normal range of affect and appropriate judgment/insight  Assessment and Plan  Well adult exam  Hypokalemia - Plan: Basic metabolic panel   Well 58 y.o. female. Counseled on diet and exercise. Advanced directive form provided today.  Flu shot, Tdap and PCV20 politely declined.  HypoK, recheck BMP at convenience, hopefully in the next week Follow up in 6 mo. The patient voiced understanding and agreement to the plan.  Shellie Dials [place], DO 10/18/23 1:52 PM

## 2023-11-01 ENCOUNTER — Other Ambulatory Visit: Payer: Self-pay

## 2023-11-01 ENCOUNTER — Encounter: Payer: Self-pay | Admitting: Family Medicine

## 2023-11-01 DIAGNOSIS — I10 Essential (primary) hypertension: Secondary | ICD-10-CM

## 2023-11-01 MED ORDER — LISINOPRIL 20 MG PO TABS: 20.0000 mg | ORAL_TABLET | Freq: Every day | ORAL | 2 refills | Status: DC

## 2023-11-02 ENCOUNTER — Other Ambulatory Visit: Payer: Self-pay | Admitting: Family Medicine

## 2023-11-02 ENCOUNTER — Other Ambulatory Visit: Payer: Self-pay | Admitting: *Deleted

## 2023-11-02 DIAGNOSIS — I10 Essential (primary) hypertension: Secondary | ICD-10-CM

## 2023-11-02 MED ORDER — LISINOPRIL 20 MG PO TABS: 20.0000 mg | ORAL_TABLET | Freq: Two times a day (BID) | ORAL | 2 refills | Status: DC

## 2023-11-02 NOTE — Telephone Encounter (Signed)
Looks like the 20 mg BID was d/d from med list. Can you confirm that this is what the pt is taking?   Copied from CRM [id number]. Topic: Clinical - Prescription Issue >> Nov 02, 2023  9:41 AM Adaysia C wrote: Reason for CRM: The wrong frequencey for lisinopril (ZESTRIL) 20 MG tablet was sent in to the patients pharmacy, now the patient is out of medication. The patient is supposed to take this pill twice a day instead of once a day, please resubmit this medication refill with the frequency of twice a day. Patient would also like a follow up call after this refill has been corrected and sent to her pharmacy. Patient's phone: [phone]

## 2024-01-24 ENCOUNTER — Encounter: Payer: Self-pay | Admitting: Medical

## 2024-01-24 ENCOUNTER — Ambulatory Visit: Admitting: Medical

## 2024-01-24 ENCOUNTER — Telehealth: Payer: Self-pay | Admitting: Family Medicine

## 2024-01-24 VITALS — BP 135/70 | HR 96 | Temp 98.8°F | Resp 18 | Ht 68.0 in | Wt 265.0 lb

## 2024-01-24 DIAGNOSIS — R197 Diarrhea, unspecified: Secondary | ICD-10-CM | POA: Diagnosis not present

## 2024-01-24 DIAGNOSIS — R110 Nausea: Secondary | ICD-10-CM

## 2024-01-24 DIAGNOSIS — R5383 Other fatigue: Secondary | ICD-10-CM | POA: Diagnosis not present

## 2024-01-24 MED ORDER — ONDANSETRON HCL 4 MG PO TABS: 4.0000 mg | ORAL_TABLET | Freq: Three times a day (TID) | ORAL | 0 refills | Status: AC | PRN

## 2024-01-24 NOTE — Progress Notes (Signed)
 Subjective:    Patient ID: Emily Zavala, female    DOB: July 22, 1966, 58 y.o.   MRN: [id number]  HPI  Emily Zavala is a 58 year old female who presents with persistent diarrhea and fatigue.  Her symptoms began on Saturday night with a queasy stomach followed by vomiting five times and experiencing loose stools. She has had approximately five or more loose stools per day since the onset of her illness. She attempted to eat fruit and spinach but was unable to keep it down, and even drinking water was difficult. She has not vomited since the initial episode but continues to have diarrhea.  She feels extremely fatigued and has been sleeping most of the time since the onset of her symptoms. She experienced sweating and chills over the weekend and into Monday and Tuesday. She feels 'drained' and 'tired' and has been unable to perform daily activities, including attending church or work.  She has not taken any antibiotics recently but did take a vitamin B12 extra strength. She is currently not taking her blood pressure medications, which include amlodipine , lisinopril , and HCTZ, due to her illness. She has not taken Imodium for her diarrhea.   She avoids using Baby Bolt due to the potential for nausea from smells. No recent changes in her diet or exposure to others with similar symptoms. No chronic abdominal pain prior to this illness.     Review of Systems  Constitutional:  Positive for chills and fatigue.  HENT:  Negative for congestion, ear discharge and ear pain.   Respiratory:  Negative for cough, choking and wheezing.   Cardiovascular:  Negative for chest pain and palpitations.  Gastrointestinal:  Positive for abdominal pain, diarrhea, nausea and vomiting. Negative for blood in stool.  Genitourinary:  Negative for dyspareunia and enuresis.  Neurological:  Negative for dizziness and headaches.  Hematological:  Negative for adenopathy. Does not bruise/bleed easily.    Past Medical  History:  Diagnosis Date   Allergy    yr around- sporatic episodes    Anemia    with fibroids    Arthritis    ankle    Asthma    stable without any medication.   Chronic pain of right ankle 08/22/2016   Diabetes mellitus without complication (HCC)    Hyperlipidemia    Hypertension    Thyroid  disease      Social History   Socioeconomic History   Marital status: Legally Separated    Spouse name: Not on file   Number of children: Not on file   Years of education: Not on file   Highest education level: Not on file  Occupational History   Not on file  Tobacco Use   Smoking status: Never   Smokeless tobacco: Never  Substance and Sexual Activity   Alcohol use: No   Drug use: No   Sexual activity: Yes    Partners: Male    Birth control/protection: None  Other Topics Concern   Not on file  Social History Narrative   Not on file   Social Drivers of Health   Financial Resource Strain: Not on file  Food Insecurity: Not on file  Transportation Needs: Not on file  Physical Activity: Not on file  Stress: Not on file  Social Connections: Not on file  Intimate Partner Violence: Not on file    Past Surgical History:  Procedure Laterality Date   ABDOMINAL HYSTERECTOMY     uterus and cervix removed, still has ovaries, 01/2010  FOOT SURGERY Right 10/18/2012   plate and screws    MYOMECTOMY ABDOMINAL APPROACH     10/1998   TONSILLECTOMY     1983    Family History  Problem Relation Age of Onset   Hypertension Mother    Cancer Mother        breast   Diabetes Mother    Dementia Mother    CAD Mother        s/p CABG   Breast cancer Mother    Diabetes Father    Cancer Father        unknown   Mental illness Father    Cancer Maternal Grandmother    Stroke Maternal Grandmother    Breast cancer Maternal Grandmother    Colon cancer Neg Hx    Colon polyps Neg Hx    Rectal cancer Neg Hx    Stomach cancer Neg Hx    Esophageal cancer Neg Hx     Allergies  Allergen  Reactions   Levaquin [Levofloxacin In D5w] Hives    Upset stomach    Penicillins Hives    Current Outpatient Medications on File Prior to Visit  Medication Sig Dispense Refill   ACCU-CHEK AVIVA PLUS test strip USE TO TEST BLOOD SUGAR ONCE D BEFORE BREAKFAST  1   albuterol  (VENTOLIN  HFA) 108 (90 Base) MCG/ACT inhaler Inhale 1 puff into the lungs every 6 (six) hours as needed for wheezing or shortness of breath. 6.7 g 1   amLODipine  (NORVASC ) 5 MG tablet TAKE 1 TABLET(5 MG) BY MOUTH DAILY 90 tablet 2   atorvastatin  (LIPITOR) 40 MG tablet TAKE 1 TABLET(40 MG) BY MOUTH DAILY 90 tablet 2   blood glucose meter kit and supplies KIT Dispense based on patient and insurance preference. Use once a day (before breakfast) as directed. 1 each 1   fluticasone  (FLOVENT  HFA) 110 MCG/ACT inhaler Inhale 2 puffs into the lungs in the morning and at bedtime. 1 each 12   glimepiride  (AMARYL ) 4 MG tablet TAKE 1 TABLET(4 MG) BY MOUTH DAILY WITH BREAKFAST 90 tablet 2   hydrochlorothiazide  (HYDRODIURIL ) 25 MG tablet TAKE 1 TABLET(25 MG) BY MOUTH DAILY 90 tablet 2   Lancets Misc. (UNISTIK 3) MISC 1 Units by Does not apply route 2 (two) times daily before a meal. Low flow, I.98mm, 28G 100 each 11   levothyroxine  (SYNTHROID ) 150 MCG tablet TAKE 1 TABLET(150 MCG) BY MOUTH DAILY BEFORE BREAKFAST 90 tablet 2   lisinopril  (ZESTRIL ) 20 MG tablet Take 1 tablet (20 mg total) by mouth 2 (two) times daily. 180 tablet 2   Omega-3 Fatty Acids (FISH OIL PO) Take by mouth.     potassium chloride  (MICRO-K ) 10 MEQ CR capsule TAKE 2 CAPSULES(20 MEQ) BY MOUTH TWICE DAILY 360 capsule 0   PREVIDENT 0.2 % SOLN Take 10 mLs by mouth at bedtime.     VITAMIN D PO Take by mouth.     VITAMIN E PO Take by mouth.     No current facility-administered medications on file prior to visit.    BP (!) 150/64   Pulse 96   Temp 98.8 F (37.1 C)   Resp 18   Ht 5\' 8"  (1.727 m)   Wt 265 lb (120.2 kg)   SpO2 96%   BMI 40.29 kg/m         Objective:   Physical Exam  General Mental Status- Alert. General Appearance- Not in acute distress. But does appear fatigue.  Skin No obvious skin dryness  Neck  No JVD.  Chest and Lung Exam Auscultation: Breath Sounds:-CTA  Cardiovascular Auscultation:Rythm- RRR Murmurs & Other Heart Sounds:Auscultation of the heart reveals- No Murmurs.  Abdomen Inspection:-Inspeection Normal. Palpation/Percussion:Note:No mass. Palpation and Percussion of the abdomen reveal- Non Tender, Non Distended + BS, no rebound or guarding.   Neurologic Cranial Nerve exam:- CN III-XII intact(No nystagmus), symmetric smile. Strength:- 5/5 equal and symmetric strength both upper and lower extremities.       Assessment & Plan:   Acute gastroenteritis Acute gastroenteritis with resolved vomiting, persistent diarrhea, fatigue, and sweating. Differential includes viral c. dif, bacterial, or parasitic infection. Discussed dehydration and electrolyte imbalance risks. Ordered stool studies and labs to guide treatment. - Order stool studies: C. diff, stool culture, ova and parasite test. - Order labs: CBC, metabolic panel, liver enzymes, kidney function, electrolytes. - Prescribe Zofran  for nausea/vomiting if needed. - Recommend Imodium post after you submit stool studies - Advise hydration with sugar-free electrolyte solutions otc. - Advise dietary modifications: breads, crackers, soups; avoid greasy foods and fruit juices.  Hypertension Hypertension management on hold due to acute illness. Advised monitoring and resuming medications if BP >140/90 mmHg. - Monitor BP daily.  i think lower bp now since likely some dehydration - Resume antihypertensives if BP >140/90 mmHg.  Follow-up Work note provided for absence due to illness. Follow-up based on symptom resolution and test results. - Provide work note for April 21-25. - Tentative return to work April 28. - Update with test results. - Consider  extending work absence if symptoms persist.  Molleigh Huot, PA-C

## 2024-01-24 NOTE — Telephone Encounter (Signed)
 Copied from CRM [id number]. Topic: Complaint (DO NOT CONVERT) - Staff >> Jan 24, 2024  3:43 PM Caliyah H wrote: Date of Incident: 01/24/2024 Details of complaint: Patient called regarding her visit earlier today. She reported that after her appointment, while having her labs drawn, the phlebotomist Adel Holt referred to her as "girl." The patient politely asked Adel Holt not to address her that way. While waiting for her lab kit, the patient expressed concern that her personal information was being shared openly, which made her uncomfortable. Adel Holt then instructed the patient to wait in the waiting area while she retrieved the kit, but the patient never received it.  Not feeling well, the patient returned to her car. She then received a phone call from [place] asking, "Hey, where are you? Why did you leave?" Adel Holt came down to the patient's car and again referred to her as "girl," despite the patient having already requested multiple times not to be called that. The patient stated she felt this was dismissive and disrespectful. Adel Holt then commented, "Your teeth are really gorgeous," which the patient perceived as sarcastic and inappropriate given the situation.  The patient emphasized that at no point did she raise her voice or use profanity, and that Adel Holt continued to laugh throughout the interaction. The patient found the repeated use of the term "girl" offensive and felt the overall interaction was disrespectful, particularly given that she was not feeling well.  How would the patient like to see it resolved? Patient is requesting a face-to-face apology following an interaction with the phlebotomist, Adel Holt, during her visit on [insert date].  The patient, who identifies as African American, expressed that she felt disrespected and devalued during the encounter. She stated that Adel Holt, who is Caucasian, repeatedly referred to her as "girl" despite being asked multiple times not to use that term. The patient  perceived this language as demeaning and dismissive, particularly in light of their racial and professional dynamic.  Additionally, the patient felt that her personal information was discussed in a public area, which made her uncomfortable. When she left to sit in her car due to feeling unwell, she received a phone call from [place] asking why she left. Adel Holt then approached the patient's car, continued using the term "girl," and made a comment about the patient's teeth being "really gorgeous," which the patient perceived as sarcastic and inappropriate given the context.  The patient stated she remained calm throughout the interaction and did not raise her voice or use inappropriate language. She feels that Yvonne's behavior suggested a sense of superiority and lack of empathy, which she found insulting and emotionally harmful.  The patient is formally requesting an in-person apology and would like this concern to be reviewed by management.  On a scale of 1-10, how was your experience?  Patient stated that her visit began positively, as she was checked in by Fredrik Jensen at the front desk, who she described as always courteous. She was also assisted by Gaylin Ke, who she commended for being very thorough in his care.  However, the visit took a negative turn when she proceeded to the lab for her bloodwork. The patient described the situation as meticulous and emotionally upsetting, noting that the phlebotomist, Adel Holt, looked her directly in the eyes and addressed her as "girl," a term she found highly disrespectful and offensive.  The patient expressed feeling disgusted by the interaction and emphasized that she has been a loyal patient at this location for over 20 years and has never experienced a problem  until this encounter.   Callback Number: [phone]   Route to Practice Administrator. >> Jan 24, 2024  4:03 PM Caliyah H wrote:  Update: How would the patient like to see it resolved? Patient is  requesting a face-to-face apology following an interaction with the phlebotomist, Adel Holt, during her visit on 01/24/2024

## 2024-01-24 NOTE — Patient Instructions (Signed)
 Acute gastroenteritis Acute gastroenteritis with resolved vomiting, persistent diarrhea, fatigue, and sweating. Differential includes viral c. dif, bacterial, or parasitic infection. Discussed dehydration and electrolyte imbalance risks. Ordered stool studies and labs to guide treatment. - Order stool studies: C. diff, stool culture, ova and parasite test. - Order labs: CBC, metabolic panel, liver enzymes, kidney function, electrolytes. - Prescribe Zofran  for nausea/vomiting if needed. - Recommend Imodium post after you submit stool studies - Advise hydration with sugar-free electrolyte solutions otc. - Advise dietary modifications: breads, crackers, soups; avoid greasy foods and fruit juices.  Hypertension Hypertension management on hold due to acute illness. Advised monitoring and resuming medications if BP >140/90 mmHg. - Monitor BP daily.  i think lower bp now since likely some dehydration - Resume antihypertensives if BP >140/90 mmHg.  Follow-up Work note provided for absence due to illness. Follow-up based on symptom resolution and test results. - Provide work note for April 21-25. - Tentative return to work April 28. - Update with test results. - Consider extending work absence if symptoms persist.

## 2024-01-25 ENCOUNTER — Encounter: Payer: Self-pay | Admitting: Medical

## 2024-01-25 ENCOUNTER — Other Ambulatory Visit

## 2024-01-25 DIAGNOSIS — R197 Diarrhea, unspecified: Secondary | ICD-10-CM | POA: Diagnosis not present

## 2024-01-25 LAB — CBC WITH DIFFERENTIAL/PLATELET
Basophils Absolute: 0 10*3/uL (ref 0.0–0.1)
Basophils Relative: 0.3 % (ref 0.0–3.0)
Eosinophils Absolute: 0 10*3/uL (ref 0.0–0.7)
Eosinophils Relative: 0.4 % (ref 0.0–5.0)
HCT: 39.8 % (ref 36.0–46.0)
Hemoglobin: 13 g/dL (ref 12.0–15.0)
Lymphocytes Relative: 7.5 % — ABNORMAL LOW (ref 12.0–46.0)
Lymphs Abs: 0.9 10*3/uL (ref 0.7–4.0)
MCHC: 32.7 g/dL (ref 30.0–36.0)
MCV: 79.1 fl (ref 78.0–100.0)
Monocytes Absolute: 0.7 10*3/uL (ref 0.1–1.0)
Monocytes Relative: 6 % (ref 3.0–12.0)
Neutro Abs: 10.2 10*3/uL — ABNORMAL HIGH (ref 1.4–7.7)
Neutrophils Relative %: 85.8 % — ABNORMAL HIGH (ref 43.0–77.0)
Platelets: 245 10*3/uL (ref 150.0–400.0)
RBC: 5.03 Mil/uL (ref 3.87–5.11)
RDW: 16.3 % — ABNORMAL HIGH (ref 11.5–15.5)
WBC: 11.9 10*3/uL — ABNORMAL HIGH (ref 4.0–10.5)

## 2024-01-25 LAB — COMPREHENSIVE METABOLIC PANEL WITH GFR
ALT: 52 U/L — ABNORMAL HIGH (ref 0–35)
AST: 56 U/L — ABNORMAL HIGH (ref 0–37)
Albumin: 3.7 g/dL (ref 3.5–5.2)
Alkaline Phosphatase: 109 U/L (ref 39–117)
BUN: 12 mg/dL (ref 6–23)
CO2: 32 meq/L (ref 19–32)
Calcium: 9.2 mg/dL (ref 8.4–10.5)
Chloride: 93 meq/L — ABNORMAL LOW (ref 96–112)
Creatinine, Ser: 1.01 mg/dL (ref 0.40–1.20)
GFR: 62 mL/min (ref >60.00)
Glucose, Bld: 138 mg/dL — ABNORMAL HIGH (ref 70–99)
Potassium: 3 meq/L — ABNORMAL LOW (ref 3.5–5.1)
Sodium: 138 meq/L (ref 135–145)
Total Bilirubin: 1.3 mg/dL — ABNORMAL HIGH (ref 0.2–1.2)
Total Protein: 6.9 g/dL (ref 6.0–8.3)

## 2024-01-25 LAB — LIPASE: Lipase: 13 U/L (ref 11.0–59.0)

## 2024-01-25 MED ORDER — POTASSIUM CHLORIDE CRYS ER 10 MEQ PO TBCR: 10.0000 meq | EXTENDED_RELEASE_TABLET | Freq: Two times a day (BID) | ORAL | 0 refills | Status: DC

## 2024-01-25 NOTE — Addendum Note (Signed)
 Addended by: Serafina Damme on: 01/25/2024 06:07 PM   Modules accepted: Orders

## 2024-01-27 ENCOUNTER — Encounter: Payer: Self-pay | Admitting: Medical

## 2024-01-27 LAB — CLOSTRIDIUM DIFFICILE BY PCR: Toxigenic C. Difficile by PCR: NEGATIVE

## 2024-01-29 LAB — STOOL CULTURE: E coli, Shiga toxin Assay: NEGATIVE

## 2024-01-31 LAB — OVA AND PARASITE EXAMINATION
CONCENTRATE RESULT:: NONE SEEN
MICRO NUMBER:: 16370777
SPECIMEN QUALITY:: ADEQUATE
TRICHROME RESULT:: NONE SEEN

## 2024-02-02 ENCOUNTER — Encounter: Payer: Self-pay | Admitting: Medical

## 2024-02-02 ENCOUNTER — Ambulatory Visit (INDEPENDENT_AMBULATORY_CARE_PROVIDER_SITE_OTHER): Admitting: Medical

## 2024-02-02 ENCOUNTER — Telehealth: Payer: Self-pay

## 2024-02-02 ENCOUNTER — Other Ambulatory Visit: Payer: Self-pay

## 2024-02-02 ENCOUNTER — Ambulatory Visit (HOSPITAL_BASED_OUTPATIENT_CLINIC_OR_DEPARTMENT_OTHER)
Admission: RE | Admit: 2024-02-02 | Discharge: 2024-02-02 | Disposition: A | Discharge status: Home / Self Care | Source: Ambulatory Visit | Attending: Medical | Admitting: Medical

## 2024-02-02 VITALS — BP 132/66 | HR 90 | Temp 98.4°F | Resp 18 | Ht 68.0 in | Wt 262.8 lb

## 2024-02-02 DIAGNOSIS — J040 Acute laryngitis: Secondary | ICD-10-CM

## 2024-02-02 DIAGNOSIS — E010 Iodine-deficiency related diffuse (endemic) goiter: Secondary | ICD-10-CM

## 2024-02-02 DIAGNOSIS — R509 Fever, unspecified: Secondary | ICD-10-CM | POA: Diagnosis not present

## 2024-02-02 DIAGNOSIS — J029 Acute pharyngitis, unspecified: Secondary | ICD-10-CM

## 2024-02-02 DIAGNOSIS — D75839 Thrombocytosis, unspecified: Secondary | ICD-10-CM

## 2024-02-02 DIAGNOSIS — E782 Mixed hyperlipidemia: Secondary | ICD-10-CM

## 2024-02-02 DIAGNOSIS — E876 Hypokalemia: Secondary | ICD-10-CM

## 2024-02-02 DIAGNOSIS — R748 Abnormal levels of other serum enzymes: Secondary | ICD-10-CM

## 2024-02-02 DIAGNOSIS — R220 Localized swelling, mass and lump, head: Secondary | ICD-10-CM

## 2024-02-02 LAB — COMPREHENSIVE METABOLIC PANEL WITH GFR
ALT: 68 U/L — ABNORMAL HIGH (ref 0–35)
AST: 45 U/L — ABNORMAL HIGH (ref 0–37)
Albumin: 4 g/dL (ref 3.5–5.2)
Alkaline Phosphatase: 117 U/L (ref 39–117)
BUN: 8 mg/dL (ref 6–23)
CO2: 37 meq/L — ABNORMAL HIGH (ref 19–32)
Calcium: 9.8 mg/dL (ref 8.4–10.5)
Chloride: 92 meq/L — ABNORMAL LOW (ref 96–112)
Creatinine, Ser: 0.93 mg/dL (ref 0.40–1.20)
GFR: 68.44 mL/min (ref >60.00)
Glucose, Bld: 121 mg/dL — ABNORMAL HIGH (ref 70–99)
Potassium: 3.7 meq/L (ref 3.5–5.1)
Sodium: 140 meq/L (ref 135–145)
Total Bilirubin: 0.7 mg/dL (ref 0.2–1.2)
Total Protein: 7.3 g/dL (ref 6.0–8.3)

## 2024-02-02 LAB — TSH: TSH: 6.92 u[IU]/mL — ABNORMAL HIGH (ref 0.35–5.50)

## 2024-02-02 LAB — T4, FREE: Free T4: 0.76 ng/dL (ref 0.60–1.60)

## 2024-02-02 LAB — POCT RAPID STREP A (OFFICE): Rapid Strep A Screen: NEGATIVE

## 2024-02-02 MED ORDER — GLIMEPIRIDE 4 MG PO TABS: ORAL_TABLET | ORAL | 2 refills | Status: AC

## 2024-02-02 MED ORDER — METHYLPREDNISOLONE 4 MG PO TABS: ORAL_TABLET | ORAL | 0 refills | Status: DC

## 2024-02-02 MED ORDER — ATORVASTATIN CALCIUM 40 MG PO TABS: ORAL_TABLET | ORAL | 2 refills | Status: DC

## 2024-02-02 MED ORDER — AZITHROMYCIN 250 MG PO TABS: ORAL_TABLET | ORAL | 0 refills | Status: AC

## 2024-02-02 NOTE — Progress Notes (Signed)
 Subjective:    Patient ID: Emily Zavala, female    DOB: 09-Jan-1966, 58 y.o.   MRN: [id number]  HPI Emily Zavala is a 58 year old female who presents with a sore throat and voice changes.  She developed a sore throat and significant voice changes, described as severe, along with chills and sweats. These symptoms began abruptly on wed after recovering from viral gastroenteritis. The voice loss has been severe, impacting her ability to work, and she requires a work note for [employer] duties only.   Her diarrhea, which was previously severe due to viral gastroenteritis, has resolved. She was using Pedialyte and consuming bananas to manage her low potassium levels. She was prescribed potassium tabs, but she prefers capsules. She is reluctant to have her potassium levels rechecked today. No muscle cramps, despite previous low potassium levels. However eventually did decide to get labs done here due to convenience. Kerney Pee came to room to draw labs.  She has a history of hypothyroid(on med for), but has not had a recent thyroid  ultrasound. Her thyroid  condition has been managed by pcp  She has experienced lip swelling, which may be related to her use of lisinopril , an ACE inhibitor. She has stopped lisinopril  and is managing her blood pressure with amlodipine , which she is currently taking.   Review of Systems  Constitutional:  Positive for fever. Negative for chills and diaphoresis.  HENT:  Positive for sore throat. Negative for congestion.        Mild upper lip swelling.  Respiratory:  Negative for chest tightness, shortness of breath and wheezing.   Cardiovascular:  Negative for chest pain and palpitations.  Gastrointestinal:  Negative for abdominal pain.  Genitourinary:  Negative for dyspareunia, enuresis and hematuria.  Musculoskeletal:  Negative for back pain and joint swelling.  Neurological:  Negative for dizziness, syncope, weakness and light-headedness.  Hematological:   Negative for adenopathy. Does not bruise/bleed easily.  Psychiatric/Behavioral:  Negative for behavioral problems and decreased concentration.     Past Medical History:  Diagnosis Date   Allergy    yr around- sporatic episodes    Anemia    with fibroids    Arthritis    ankle    Asthma    stable without any medication.   Chronic pain of right ankle 08/22/2016   Diabetes mellitus without complication (HCC)    Hyperlipidemia    Hypertension    Thyroid  disease      Social History   Socioeconomic History   Marital status: Legally Separated    Spouse name: Not on file   Number of children: Not on file   Years of education: Not on file   Highest education level: Not on file  Occupational History   Not on file  Tobacco Use   Smoking status: Never   Smokeless tobacco: Never  Substance and Sexual Activity   Alcohol use: No   Drug use: No   Sexual activity: Yes    Partners: Male    Birth control/protection: None  Other Topics Concern   Not on file  Social History Narrative   Not on file   Social Drivers of Health   Financial Resource Strain: Not on file  Food Insecurity: Not on file  Transportation Needs: Not on file  Physical Activity: Not on file  Stress: Not on file  Social Connections: Not on file  Intimate Partner Violence: Not on file    Past Surgical History:  Procedure Laterality Date   ABDOMINAL HYSTERECTOMY  uterus and cervix removed, still has ovaries, 01/2010   FOOT SURGERY Right 10/18/2012   plate and screws    MYOMECTOMY ABDOMINAL APPROACH     10/1998   TONSILLECTOMY     1983    Family History  Problem Relation Age of Onset   Hypertension Mother    Cancer Mother        breast   Diabetes Mother    Dementia Mother    CAD Mother        s/p CABG   Breast cancer Mother    Diabetes Father    Cancer Father        unknown   Mental illness Father    Cancer Maternal Grandmother    Stroke Maternal Grandmother    Breast cancer Maternal  Grandmother    Colon cancer Neg Hx    Colon polyps Neg Hx    Rectal cancer Neg Hx    Stomach cancer Neg Hx    Esophageal cancer Neg Hx     Allergies  Allergen Reactions   Levaquin [Levofloxacin In D5w] Hives    Upset stomach    Penicillins Hives    Current Outpatient Medications on File Prior to Visit  Medication Sig Dispense Refill   ACCU-CHEK AVIVA PLUS test strip USE TO TEST BLOOD SUGAR ONCE D BEFORE BREAKFAST  1   albuterol  (VENTOLIN  HFA) 108 (90 Base) MCG/ACT inhaler Inhale 1 puff into the lungs every 6 (six) hours as needed for wheezing or shortness of breath. 6.7 g 1   amLODipine  (NORVASC ) 5 MG tablet TAKE 1 TABLET(5 MG) BY MOUTH DAILY 90 tablet 2   blood glucose meter kit and supplies KIT Dispense based on patient and insurance preference. Use once a day (before breakfast) as directed. 1 each 1   fluticasone  (FLOVENT  HFA) 110 MCG/ACT inhaler Inhale 2 puffs into the lungs in the morning and at bedtime. 1 each 12   hydrochlorothiazide  (HYDRODIURIL ) 25 MG tablet TAKE 1 TABLET(25 MG) BY MOUTH DAILY 90 tablet 2   Lancets Misc. (UNISTIK 3) MISC 1 Units by Does not apply route 2 (two) times daily before a meal. Low flow, I.37mm, 28G 100 each 11   levothyroxine  (SYNTHROID ) 150 MCG tablet TAKE 1 TABLET(150 MCG) BY MOUTH DAILY BEFORE BREAKFAST 90 tablet 2   lisinopril  (ZESTRIL ) 20 MG tablet Take 1 tablet (20 mg total) by mouth 2 (two) times daily. 180 tablet 2   Omega-3 Fatty Acids (FISH OIL PO) Take by mouth.     ondansetron  (ZOFRAN ) 4 MG tablet Take 1 tablet (4 mg total) by mouth every 8 (eight) hours as needed for nausea or vomiting. 20 tablet 0   PREVIDENT 0.2 % SOLN Take 10 mLs by mouth at bedtime.     VITAMIN D PO Take by mouth.     VITAMIN E PO Take by mouth.     No current facility-administered medications on file prior to visit.    BP 132/66   Pulse 90   Temp 98.4 F (36.9 C)   Resp 18   Ht 5\' 8"  (1.727 m)   Wt 262 lb 12.8 oz (119.2 kg)   SpO2 95%   BMI 39.96 kg/m         Objective:   Physical Exam   General Mental Status- Alert. General Appearance- Not in acute distress.   Skin General: Color- Normal Color. Moisture- Normal Moisture.  Neck No JVD. Thyromegaly on palpation. Rt side feels larger than left.  Chest and Lung Exam  Auscultation: Breath Sounds:-CTA  Cardiovascular Auscultation:Rythm- RRR Murmurs & Other Heart Sounds:Auscultation of the heart reveals- No Murmurs.  Abdomen Inspection:-Inspeection Normal. Palpation/Percussion:Note:No mass. Palpation and Percussion of the abdomen reveal- Non Tender, Non Distended + BS, no rebound or guarding.    Neurologic Cranial Nerve exam:- CN III-XII intact(No nystagmus), symmetric smile. Strength:- 5/5 equal and symmetric strength both upper and lower extremities.    Heent- mild pinkish red posterior pharynx. No exudate. No hypertrophy. No neck stiffness. No lymphandenopathy. No sinus pressure.    Assessment & Plan:   Hypertension . Lisinopril  may cause angioedema. - Discontinue lisinopril . - Increase amlodipine  to 10 mg. - Monitor blood pressure response.  Possible Angioedema due to ACE inhibitor(howevere only mild swelling). no current dyspnea or wheezing Mild upper lip swelling likely due to lisinopril , a known ACE inhibitor side effect. - Discontinue lisinopril . - Prescribe Medrol 3-day taper for lip swelling.(wanted to give higher dose but pt very reluctant to use though agreed to modified low dose taper) - Advise emergency care if swelling worsens or shortness of breath occurs.  Sore throat and voice change Moderate to severe sore throat with voice change, chills, and sweats post-viral gastroenteritis. -rapid strep today negative. send out throat culture. - Prescribe azithromycin. - Advise rest and hydration. 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 Potassium-rich diet  advised. - Recheck potassium levels later. - Encourage potassium-rich foods like bananas. -when level back may need to get you back on k capsules.  Thyromegaly and on thyroid  med(hypothyroid).  Managed by another physician. No recent thyroid  ultrasound. - Order TSH and T4.  Folow up in next wed pcp or sooner if needed  Time spent with patient today was  43 minutes which consisted of chart review, discussing diagnosis, work up treatment and documentation.

## 2024-02-02 NOTE — Telephone Encounter (Signed)
 Refill has been sent. Copied from CRM [id number]. Topic: Clinical - Medication Refill >> Feb 02, 2024 10:22 AM Marcy Sexton wrote: Most Recent Primary Care Visit:  Provider: Sylvia Everts  Department: [facility]  Visit Type: ACUTE  Date: 01/24/2024  Medication: levothyroxine  (SYNTHROID ) 150 MCG tablet glimepiride  (AMARYL ) 4 MG tablet atorvastatin  (LIPITOR) 40 MG tablet  Has the patient contacted their pharmacy? Yes (Agent: If no, request that the patient contact the pharmacy for the refill. If patient does not wish to contact the pharmacy document the reason why and proceed with request.) (Agent: If yes, when and what did the pharmacy advise?)  Is this the correct pharmacy for this prescription? Yes If no, delete pharmacy and type the correct one.  This is the patient's preferred pharmacy:  [id number] - [place], [place] - [address] AT [address] [place] [address] Phone: [phone] Fax: [phone]     Has the prescription been filled recently? Yes  Is the patient out of the medication? Yes  Has the patient been seen for an appointment in the last year OR does the patient have an upcoming appointment? Yes  Can we respond through MyChart? Yes  Agent: Please be advised that Rx refills may take up to 3 business days. We ask that you follow-up with your pharmacy.

## 2024-02-02 NOTE — Patient Instructions (Signed)
 Hypertension . Lisinopril  may cause angioedema. - Discontinue lisinopril . - Increase amlodipine  to 10 mg. - Monitor blood pressure response.  Possible Angioedema due to ACE inhibitor(howevere only mild swelling). no current dyspnea or wheezing Mild lip swelling likely due to lisinopril , a known ACE inhibitor side effect. - Discontinue lisinopril . - Prescribe Medrol 3-day taper for lip swelling. - Advise emergency care if swelling worsens or shortness of breath occurs.  Sore throat and voice change Moderate to severe sore throat with voice change, chills, and sweats post-viral gastroenteritis. -rapid strep today negative. send out throat culture. - Prescribe azithromycin. - Advise rest and hydration. - Provide work note for limited duties.  Hypokalemia Low potassium levels post-diarrhea episode. Potassium-rich diet advised. - Recheck potassium levels later. - Encourage potassium-rich foods like bananas. -when level back may need to get you back on k capsules.  Thyromegaly and on thyroid  med(hypothyroid).  Managed by another physician. No recent thyroid  ultrasound. - Order TSH and T4.  Folow up in next wed pcp or sooner if needed

## 2024-02-03 ENCOUNTER — Encounter: Payer: Self-pay | Admitting: Medical

## 2024-02-04 ENCOUNTER — Encounter: Payer: Self-pay | Admitting: Medical

## 2024-02-04 DIAGNOSIS — E039 Hypothyroidism, unspecified: Secondary | ICD-10-CM

## 2024-02-04 LAB — CBC WITH DIFFERENTIAL/PLATELET
Basophils Absolute: 0.1 10*3/uL (ref 0.0–0.1)
Basophils Relative: 1.2 % (ref 0.0–3.0)
Eosinophils Absolute: 0.1 10*3/uL (ref 0.0–0.7)
Eosinophils Relative: 1.1 % (ref 0.0–5.0)
HCT: 42.1 % (ref 36.0–46.0)
Hemoglobin: 13.5 g/dL (ref 12.0–15.0)
Lymphocytes Relative: 25 % (ref 12.0–46.0)
Lymphs Abs: 1.4 10*3/uL (ref 0.7–4.0)
MCHC: 32 g/dL (ref 30.0–36.0)
MCV: 80.4 fl (ref 78.0–100.0)
Monocytes Absolute: 0.3 10*3/uL (ref 0.1–1.0)
Monocytes Relative: 4.6 % (ref 3.0–12.0)
Neutro Abs: 3.7 10*3/uL (ref 1.4–7.7)
Neutrophils Relative %: 68.1 % (ref 43.0–77.0)
Platelets: 643 10*3/uL — ABNORMAL HIGH (ref 150.0–400.0)
RBC: 5.23 Mil/uL — ABNORMAL HIGH (ref 3.87–5.11)
RDW: 16.4 % — ABNORMAL HIGH (ref 11.5–15.5)
WBC: 5.5 10*3/uL (ref 4.0–10.5)

## 2024-02-04 LAB — CULTURE, GROUP A STREP
Micro Number: 16406106
SPECIMEN QUALITY:: ADEQUATE

## 2024-02-05 NOTE — Telephone Encounter (Unsigned)
 Copied from CRM [id number]. Topic: Clinical - Medication Refill >> Feb 05, 2024 10:23 AM Baldomero Bone wrote: Most Recent Primary Care Visit:  Provider: Sylvia Everts  Department: [facility]  Visit Type: ACUTE  Date: 01/24/2024  Medication: levothyroxine  (SYNTHROID ) 150 MCG tablet  Has the patient contacted their pharmacy? Yes (Agent: If no, request that the patient contact the pharmacy for the refill. If patient does not wish to contact the pharmacy document the reason why and proceed with request.) (Agent: If yes, when and what did the pharmacy advise?)Pharmacy called in refill  Is this the correct pharmacy for this prescription? Yes If no, delete pharmacy and type the correct one.  This is the patient's preferred pharmacy:  [id number] - [place], [place] - [address] AT [facility] [address] [place] [address] Phone: [phone] Fax: [phone]  Has the prescription been filled recently? No  Is the patient out of the medication? Yes  Has the patient been seen for an appointment in the last year OR does the patient have an upcoming appointment? Yes  Can we respond through MyChart? No  Agent: Please be advised that Rx refills may take up to 3 business days. We ask that you follow-up with your pharmacy.

## 2024-02-05 NOTE — Addendum Note (Signed)
 Addended by: Serafina Damme on: 02/05/2024 09:24 PM   Modules accepted: Orders

## 2024-02-06 ENCOUNTER — Telehealth (HOSPITAL_BASED_OUTPATIENT_CLINIC_OR_DEPARTMENT_OTHER): Payer: Self-pay

## 2024-02-06 MED ORDER — LEVOTHYROXINE SODIUM 150 MCG PO TABS
150.0000 ug | ORAL_TABLET | Freq: Every day | ORAL | 1 refills | Status: AC
Start: 2024-02-06 — End: ?

## 2024-02-13 ENCOUNTER — Telehealth (HOSPITAL_BASED_OUTPATIENT_CLINIC_OR_DEPARTMENT_OTHER): Payer: Self-pay

## 2024-02-21 ENCOUNTER — Telehealth (HOSPITAL_BASED_OUTPATIENT_CLINIC_OR_DEPARTMENT_OTHER): Payer: Self-pay

## 2024-04-09 ENCOUNTER — Ambulatory Visit: Payer: Self-pay

## 2024-04-09 NOTE — Telephone Encounter (Signed)
  FYI Only or Action Required?: FYI only for provider.  Patient was last seen in primary care on 02/02/2024 by Dorina Loving, PA-C.  Called Nurse Triage reporting Leg Pain.  Symptoms began several days ago.  Interventions attempted: Nothing.  Symptoms are: unchanged.  Triage Disposition: see PCP within 1 Week. Apt on Friday per request  Patient/caregiver understands and will follow disposition?: Yes         Copied from CRM [id number]. Topic: Clinical - Red Word Triage >> Apr 09, 2024  8:21 AM [name] wrote: Red Word that prompted transfer to Nurse Triage: Pain in left leg - sometimes can't move/bend Reason for Disposition  Localized pain, redness or hard lump along vein  Answer Assessment - Initial Assessment Questions 1. ONSET: When did the pain start?      Saturday 2. LOCATION: Where is the pain located?      Left leg 3. PAIN: How bad is the pain?    (Scale 1-10; or mild, moderate, severe)   -  MILD (1-3): doesn't interfere with normal activities    -  MODERATE (4-7): interferes with normal activities (e.g., work or school) or awakens from sleep, limping    -  SEVERE (8-10): excruciating pain, unable to do any normal activities, unable to walk     Mild 4. WORK OR EXERCISE: Has there been any recent work or exercise that involved this part of the body?      States was  getting dressed and leg buckled  5. CAUSE: What do you think is causing the leg pain?     unknown 6. OTHER SYMPTOMS: Do you have any other symptoms? (e.g., chest pain, back pain, breathing difficulty, swelling, rash, fever, numbness, weakness)     no 7. PREGNANCY: Is there any chance you are pregnant? When was your last menstrual period?     na  Protocols used: Leg Pain-A-AH

## 2024-04-12 ENCOUNTER — Encounter: Payer: Self-pay | Admitting: Family Medicine

## 2024-04-12 ENCOUNTER — Ambulatory Visit: Admitting: Family Medicine

## 2024-04-12 VITALS — BP 130/72 | HR 100 | Temp 98.0°F | Resp 16 | Ht 68.0 in | Wt 261.0 lb

## 2024-04-12 DIAGNOSIS — M79605 Pain in left leg: Secondary | ICD-10-CM | POA: Diagnosis not present

## 2024-04-12 DIAGNOSIS — I10 Essential (primary) hypertension: Secondary | ICD-10-CM | POA: Diagnosis not present

## 2024-04-12 DIAGNOSIS — M7989 Other specified soft tissue disorders: Secondary | ICD-10-CM | POA: Diagnosis not present

## 2024-04-12 MED ORDER — POTASSIUM CHLORIDE ER 10 MEQ PO CPCR
20.0000 meq | ORAL_CAPSULE | Freq: Two times a day (BID) | ORAL | 1 refills | Status: AC
Start: 2024-04-12 — End: ?

## 2024-04-12 NOTE — Progress Notes (Signed)
 Musculoskeletal Exam  Patient: Emily Zavala DOB: 21-Mar-1966  DOS: 04/12/2024  SUBJECTIVE:  Chief Complaint:   Chief Complaint  Patient presents with   Leg Pain    Leg Pain    Emily Zavala is a 58 y.o.  female for evaluation and treatment of L leg pain.   Onset:  2 months ago. Fell on knee at work Location: circumferentially around knee, thigh and calf Character:  twisting  Progression of issue:  has worsened Associated symptoms: affecting her gait; not as active; +swelling and bruising No redness Treatment: to date has been heat.   Neurovascular symptoms: no  Past Medical History:  Diagnosis Date   Allergy    yr around- sporatic episodes    Anemia    with fibroids    Arthritis    ankle    Asthma    stable without any medication.   Chronic pain of right ankle 08/22/2016   Diabetes mellitus without complication (HCC)    Hyperlipidemia    Hypertension    Thyroid  disease     Objective: VITAL SIGNS: BP 130/72 (BP Location: Left Arm, Patient Position: Sitting)   Pulse 100   Temp 98 F (36.7 C) (Oral)   Resp 16   Ht 5' 8 (1.727 m)   Wt 261 lb (118.4 kg)   SpO2 96%   BMI 39.68 kg/m  Constitutional: Well formed, well developed. No acute distress. Heart: 2+ pitting lower extremity edema on the left tapering at the proximal third of the tibia Thorax & Lungs: No accessory muscle use Musculoskeletal: LLE.   Normal active range of motion: yes.   Normal passive range of motion: yes Tenderness to palpation: yes over medial distal thigh, medial leg, knee Deformity: no Ecchymosis: no Tests positive: Homan's Tests negative: None Neurologic: Normal sensory function. Antalgic gait.  Psychiatric: Normal mood. Age appropriate judgment and insight. Alert & oriented x 3.    Assessment:  Left leg pain - Plan: US  Venous Img Lower Unilateral Left  Swelling of left lower extremity - Plan: US  Venous Img Lower Unilateral Left  Essential hypertension, benign -  Plan: potassium chloride  (MICRO-K ) 10 MEQ CR capsule  Plan: New problem with uncertain prognosis.  Check lower extremity duplex.  Stretches and exercises for knee and calf provided assuming she does not have a clot.  Heat, ice, Tylenol .  PT if no better. F/u as originally scheduled in a couple weeks. The patient voiced understanding and agreement to the plan.   Mabel Mt [place], DO 04/12/24  3:28 PM

## 2024-04-12 NOTE — Patient Instructions (Signed)
 Heat (pad or rice pillow in microwave) over affected area, 10-15 minutes twice daily.   OK to take Tylenol  1000 mg (2 extra strength tabs) or 975 mg (3 regular strength tabs) every 6 hours as needed.  Ibuprofen 400-600 mg (2-3 over the counter strength tabs) every 6 hours as needed for pain.  Let us  know if you need anything.  Knee Exercises It is normal to feel mild stretching, pulling, tightness, or discomfort as you do these exercises, but you should stop right away if you feel sudden pain or your pain gets worse.  STRETCHING AND RANGE OF MOTION EXERCISES  These exercises warm up your muscles and joints and improve the movement and flexibility of your knee. These exercises also help to relieve pain, numbness, and tingling. Exercise A: Knee Extension, Prone  Lie on your abdomen on a bed. Place your left / right knee just beyond the edge of the surface so your knee is not on the bed. You can put a towel under your left / right thigh just above your knee for comfort. Relax your leg muscles and allow gravity to straighten your knee. You should feel a stretch behind your left / right knee. Hold this position for 30 seconds. Scoot up so your knee is supported between repetitions. Repeat 2 times. Complete this stretch 3 times per week. Exercise B: Knee Flexion, Active     Lie on your back with both knees straight. If this causes back discomfort, bend your left / right knee so your foot is flat on the floor. Slowly slide your left / right heel back toward your buttocks until you feel a gentle stretch in the front of your knee or thigh. Hold this position for 30 seconds. Slowly slide your left / right heel back to the starting position. Repeat 2 times. Complete this exercise 3 times per week. Exercise C: Quadriceps, Prone     Lie on your abdomen on a firm surface, such as a bed or padded floor. Bend your left / right knee and hold your ankle. If you cannot reach your ankle or pant leg, loop  a belt around your foot and grab the belt instead. Gently pull your heel toward your buttocks. Your knee should not slide out to the side. You should feel a stretch in the front of your thigh and knee. Hold this position for 30 seconds. Repeat 2 times. Complete this stretch 3 times per week. Exercise D: Hamstring, Supine  Lie on your back. Loop a belt or towel over the ball of your left / right foot. The ball of your foot is on the walking surface, right under your toes. Straighten your left / right knee and slowly pull on the belt to raise your leg until you feel a gentle stretch behind your knee. Do not let your left / right knee bend while you do this. Keep your other leg flat on the floor. Hold this position for 30 seconds. Repeat 2 times. Complete this stretch 3 times per week. STRENGTHENING EXERCISES  These exercises build strength and endurance in your knee. Endurance is the ability to use your muscles for a long time, even after they get tired. Exercise E: Quadriceps, Isometric     Lie on your back with your left / right leg extended and your other knee bent. Put a rolled towel or small pillow under your knee if told by your health care provider. Slowly tense the muscles in the front of your left / right  thigh. You should see your kneecap slide up toward your hip or see increased dimpling just above the knee. This motion will push the back of the knee toward the floor. For 3 seconds, keep the muscle as tight as you can without increasing your pain. Relax the muscles slowly and completely. Repeat for 10 total reps Repeat 2 ti mes. Complete this exercise 3 times per week. Exercise F: Straight Leg Raises - Quadriceps  Lie on your back with your left / right leg extended and your other knee bent. Tense the muscles in the front of your left / right thigh. You should see your kneecap slide up or see increased dimpling just above the knee. Your thigh may even shake a bit. Keep these muscles  tight as you raise your leg 4-6 inches (10-15 cm) off the floor. Do not let your knee bend. Hold this position for 3 seconds. Keep these muscles tense as you lower your leg. Relax your muscles slowly and completely after each repetition. 10 total reps. Repeat 2 times. Complete this exercise 3 times per week.  Exercise G: Hamstring Curls     If told by your health care provider, do this exercise while wearing ankle weights. Begin with 5 lb weights (optional). Then increase the weight by 1 lb (0.5 kg) increments. Do not wear ankle weights that are more than 20 lbs to start with. Lie on your abdomen with your legs straight. Bend your left / right knee as far as you can without feeling pain. Keep your hips flat against the floor. Hold this position for 3 seconds. Slowly lower your leg to the starting position. Repeat for 10 reps.  Repeat 2 times. Complete this exercise 3 times per week. Exercise H: Squats (Quadriceps)  Stand in front of a table, with your feet and knees pointing straight ahead. You may rest your hands on the table for balance but not for support. Slowly bend your knees and lower your hips like you are going to sit in a chair. Keep your weight over your heels, not over your toes. Keep your lower legs upright so they are parallel with the table legs. Do not let your hips go lower than your knees. Do not bend lower than told by your health care provider. If your knee pain increases, do not bend as low. Hold the squat position for 1 second. Slowly push with your legs to return to standing. Do not use your hands to pull yourself to standing. Repeat 2 times. Complete this exercise 3 times per week. Exercise I: Wall Slides (Quadriceps)     Lean your back against a smooth wall or door while you walk your feet out 18-24 inches (46-61 cm) from it. Place your feet hip-width apart. Slowly slide down the wall or door until your knees Repeat 2 times. Complete this exercise every other  day. Exercise K: Straight Leg Raises - Hip Abductors  Lie on your side with your left / right leg in the top position. Lie so your head, shoulder, knee, and hip line up. You may bend your bottom knee to help you keep your balance. Roll your hips slightly forward so your hips are stacked directly over each other and your left / right knee is facing forward. Leading with your heel, lift your top leg 4-6 inches (10-15 cm). You should feel the muscles in your outer hip lifting. Do not let your foot drift forward. Do not let your knee roll toward the ceiling. Hold this  position for 3 seconds. Slowly return your leg to the starting position. Let your muscles relax completely after each repetition. 10 total reps. Repeat 2 times. Complete this exercise 3 times per week. Exercise J: Straight Leg Raises - Hip Extensors  Lie on your abdomen on a firm surface. You can put a pillow under your hips if that is more comfortable. Tense the muscles in your buttocks and lift your left / right leg about 4-6 inches (10-15 cm). Keep your knee straight as you lift your leg. Hold this position for 3 seconds. Slowly lower your leg to the starting position. Let your leg relax completely after each repetition. Repeat 2 times. Complete this exercise 3 times per week. Document Released: 08/03/2005 Document Revised: 06/13/2016 Document Reviewed: 07/26/2015 Elsevier Interactive Patient Education  2017 [employer]  Stretching and range of motion exercises These exercises warm up your muscles and joints and improve the movement and flexibility of your lower leg. These exercises also help to relieve pain and stiffness.  Exercise A: Gastrocnemius stretch Sit with your left / right leg extended. Loop a belt or towel around the ball of your left / right foot. The ball of your foot is on the walking surface, right under your toes. Hold both ends of the belt or towel. Keep your left / right ankle and foot relaxed and keep  your knee straight while you use the belt or towel to pull your foot and ankle toward you. Stop at the first point of resistance. Hold this position for 30 seconds. Repeat 2 times. Complete this exercise 3 times per week.  Exercise B: Ankle alphabet Sit with your left / right leg supported at the lower leg. Do not rest your foot on anything. Make sure your foot has room to move freely. Think of your left / right foot as a paintbrush, and move your foot to trace each letter of the alphabet in the air. Keep your hip and knee still while you trace. Trace every letter from A to Z. Repeat 2 times. Complete this exercise 3 times per week.  Strengthening exercises These exercises build strength and endurance in your lower leg. Endurance is the ability to use your muscles for a long time, even after they get tired.  Exercise C: Plantar flexors with band Sit with your left / right leg extended. Loop a rubber exercise band or tube around the ball of your left / right foot. The ball of your foot is on the walking surface, right under your toes. While holding both ends of the band or tube, slowly point your toes downward, pushing them away from you. Hold this position for 3 seconds. Slowly return your foot to the starting position and repeat for a total of 10 repetitions. Repeat 2 times. Complete this exercise 3 times per week.  Exercise D: Plantar flexors, standing Stand with your feet shoulder-width apart. Place your hands on a wall or table to steady yourself as needed, but try not to use it very much for support. Rise up on your toes. If this exercise is too easy, try these options: Shift your weight toward your left / right leg until you feel challenged. If told by your health care provider, stand on your left / right foot only. Hold this position for 3 seconds. Repeat for a total of 10 repetitions. Repeat 2 times. Complete this exercise 3 times per week.  Exercise E: Plantar flexors,  eccentric Stand on the balls of your feet on the  edge of a step. The ball of your foot is on the walking surface, right under your toes. Place your hands on a wall or railing for balance as needed, but try not to lean on it for support. Rise up on your toes, using both legs to help. Slowly shift all of your weight to your left / right foot and lift your other foot off the step. Slowly lower your left / right heel so it drops below the level of the step. You will feel a slight stretch in your left / right calf. Put your other foot back onto the step. Repeat 2 times. Complete this exercise 3 times per week. This information is not intended to replace advice given to you by your health care provider. Make sure you discuss any questions you have with your health care provider.

## 2024-04-19 ENCOUNTER — Ambulatory Visit: Payer: Federal, State, Local not specified - PPO | Admitting: Family Medicine

## 2024-04-19 ENCOUNTER — Other Ambulatory Visit (HOSPITAL_BASED_OUTPATIENT_CLINIC_OR_DEPARTMENT_OTHER)

## 2024-04-26 ENCOUNTER — Encounter: Payer: Self-pay | Admitting: Family Medicine

## 2024-04-26 ENCOUNTER — Ambulatory Visit (HOSPITAL_BASED_OUTPATIENT_CLINIC_OR_DEPARTMENT_OTHER)
Admission: RE | Admit: 2024-04-26 | Discharge: 2024-04-26 | Disposition: A | Discharge status: Home / Self Care | Source: Ambulatory Visit | Attending: Family Medicine | Admitting: Family Medicine

## 2024-04-26 ENCOUNTER — Ambulatory Visit: Admitting: Family Medicine

## 2024-04-26 VITALS — BP 128/78 | HR 93 | Temp 98.0°F | Resp 16 | Ht 68.0 in | Wt 260.0 lb

## 2024-04-26 DIAGNOSIS — Z7984 Long term (current) use of oral hypoglycemic drugs: Secondary | ICD-10-CM | POA: Diagnosis not present

## 2024-04-26 DIAGNOSIS — E039 Hypothyroidism, unspecified: Secondary | ICD-10-CM

## 2024-04-26 DIAGNOSIS — M7989 Other specified soft tissue disorders: Secondary | ICD-10-CM | POA: Insufficient documentation

## 2024-04-26 DIAGNOSIS — M79605 Pain in left leg: Secondary | ICD-10-CM | POA: Diagnosis not present

## 2024-04-26 DIAGNOSIS — I10 Essential (primary) hypertension: Secondary | ICD-10-CM

## 2024-04-26 DIAGNOSIS — E1165 Type 2 diabetes mellitus with hyperglycemia: Secondary | ICD-10-CM

## 2024-04-26 NOTE — Patient Instructions (Addendum)
 Give us  2-3 business days to get the results of your labs back.   Keep the diet clean and stay active.  The Shingrix vaccine (for shingles) is a 2 shot series spaced 2-6 months apart. It can make people feel low energy, achy and almost like they have the flu for 48 hours after injection. 1/5 people can have nausea and/or vomiting. Please plan accordingly when deciding on when to get this shot. Call our office for a nurse visit appointment to get this. The second shot of the series is less severe regarding the side effects, but it still lasts 48 hours.   For the swelling in your lower extremities, be sure to elevate your legs when able, mind the salt intake, stay physically active and consider wearing compression stockings.  Try those stretches and exercises. If no improvement with your calf in the next 3-4 weeks, send me a message and we will get you set up with the physical therapy team.   Let us  know if you need anything.

## 2024-04-26 NOTE — Progress Notes (Signed)
 Subjective:  CC: DM visit  Emily Zavala is a 58 y.o. female here for follow-up of diabetes.   Emily Zavala does not routinely check her sugars.  Patient does not require insulin.   Medications include: Amaryl  4 mg daily Diet is healthy.  Exercise: walking  Hypertension Patient presents for hypertension follow up. She does not monitor home blood pressures. She is compliant with medications-lisinopril  20 mg daily, Norvasc  5 mg daily. Patient has these side effects of medication: none Diet/exercise as above. No chest pain or shortness of breath.  Hypothyroidism Patient presents for follow-up of hypothyroidism.  Reports compliance with medication-levothyroxine  150 mcg daily. Current symptoms include: denies fatigue, weight changes, heat/cold intolerance, bowel/skin changes or CVS symptoms She believes her dose should be not significantly changed  Past Medical History:  Diagnosis Date   Allergy    yr around- sporatic episodes    Anemia    with fibroids    Arthritis    ankle    Asthma    stable without any medication.   Chronic pain of right ankle 08/22/2016   Diabetes mellitus without complication (HCC)    Hyperlipidemia    Hypertension    Thyroid  disease      Related testing: Retinal exam: Done Pneumovax: done  Objective:  BP 128/78 (BP Location: Left Arm, Patient Position: Sitting)   Pulse 93   Temp 98 F (36.7 C) (Oral)   Resp 16   Ht 5' 8 (1.727 m)   Wt 260 lb (117.9 kg)   SpO2 96%   BMI 39.53 kg/m  General:  Well developed, well nourished, in no apparent distress Skin:  Warm, no pallor or diaphoresis Head:  Normocephalic, atraumatic Eyes:  Pupils equal and round, sclera anicteric without injection  Lungs:  CTAB, no access msc use Cardio:  RRR, no bruits, no LE edema Musculoskeletal:  Symmetrical muscle groups noted without atrophy or deformity Neuro:  Sensation intact to pinprick on feet Psych: Age appropriate judgment and insight  Assessment:    Type 2 diabetes mellitus with hyperglycemia, without long-term current use of insulin (HCC) - Plan: Comprehensive metabolic panel with GFR, Lipid panel, Hemoglobin A1c, Microalbumin / creatinine urine ratio  Essential hypertension, benign  Hypothyroidism, unspecified type - Plan: T4, free, TSH   Plan:   Chronic, stable.  Continue Amaryl  4 mg daily, Lipitor 40 mg daily counseled on diet and exercise. Chronic, stable.  Continue Norvasc  5 mg daily, lisinopril  20 mg daily. Chronic, stable.  Continue levothyroxine  150 mcg daily. F/u in 6 mo. The patient voiced understanding and agreement to the plan.  Mabel Mt [place], DO 04/26/24 4:11 PM

## 2024-04-26 NOTE — Addendum Note (Signed)
 Addended by: ESTELLE GILLIS D on: 04/26/2024 04:53 PM   Modules accepted: Orders

## 2024-04-27 ENCOUNTER — Ambulatory Visit: Payer: Self-pay | Admitting: Family Medicine

## 2024-04-27 LAB — COMPREHENSIVE METABOLIC PANEL WITH GFR
AG Ratio: 1.5 (calc) (ref 1.0–2.5)
ALT: 20 U/L (ref 6–29)
AST: 18 U/L (ref 10–35)
Albumin: 4.2 g/dL (ref 3.6–5.1)
Alkaline phosphatase (APISO): 93 U/L (ref 37–153)
BUN: 12 mg/dL (ref 7–25)
CO2: 32 mmol/L (ref 20–32)
Calcium: 9.6 mg/dL (ref 8.6–10.4)
Chloride: 100 mmol/L (ref 98–110)
Creat: 0.7 mg/dL (ref 0.50–1.03)
Globulin: 2.8 g/dL (ref 1.9–3.7)
Glucose, Bld: 101 mg/dL — ABNORMAL HIGH (ref 65–99)
Potassium: 3.1 mmol/L — ABNORMAL LOW (ref 3.5–5.3)
Sodium: 142 mmol/L (ref 135–146)
Total Bilirubin: 0.7 mg/dL (ref 0.2–1.2)
Total Protein: 7 g/dL (ref 6.1–8.1)
eGFR: 101 mL/min/1.73m2 (ref >=60)

## 2024-04-27 LAB — HEMOGLOBIN A1C
Hgb A1c MFr Bld: 6.6 % — ABNORMAL HIGH (ref <5.7)
Mean Plasma Glucose: 143 mg/dL
eAG (mmol/L): 7.9 mmol/L

## 2024-04-27 LAB — LIPID PANEL
Cholesterol: 172 mg/dL (ref <200)
HDL: 64 mg/dL (ref >=50)
LDL Cholesterol (Calc): 91 mg/dL
Non-HDL Cholesterol (Calc): 108 mg/dL (ref <130)
Total CHOL/HDL Ratio: 2.7 (calc) (ref <5.0)
Triglycerides: 78 mg/dL (ref <150)

## 2024-04-27 LAB — T4, FREE: Free T4: 1.7 ng/dL (ref 0.8–1.8)

## 2024-04-27 LAB — TSH: TSH: 0.15 m[IU]/L — ABNORMAL LOW (ref 0.40–4.50)

## 2024-04-30 ENCOUNTER — Other Ambulatory Visit: Payer: Self-pay | Admitting: Family Medicine

## 2024-04-30 ENCOUNTER — Other Ambulatory Visit: Payer: Self-pay

## 2024-04-30 DIAGNOSIS — E875 Hyperkalemia: Secondary | ICD-10-CM

## 2024-04-30 DIAGNOSIS — I10 Essential (primary) hypertension: Secondary | ICD-10-CM

## 2024-07-08 ENCOUNTER — Other Ambulatory Visit: Payer: Self-pay | Admitting: Family Medicine

## 2024-07-08 ENCOUNTER — Other Ambulatory Visit: Payer: Self-pay

## 2024-07-08 DIAGNOSIS — I10 Essential (primary) hypertension: Secondary | ICD-10-CM

## 2024-07-08 DIAGNOSIS — Z1231 Encounter for screening mammogram for malignant neoplasm of breast: Secondary | ICD-10-CM

## 2024-07-08 MED ORDER — AMLODIPINE BESYLATE 5 MG PO TABS: ORAL_TABLET | ORAL | 2 refills | Status: AC

## 2024-07-11 ENCOUNTER — Inpatient Hospital Stay: Admission: RE | Admit: 2024-07-11 | Discharge: 2024-07-11 | Attending: Family Medicine

## 2024-07-11 DIAGNOSIS — Z1231 Encounter for screening mammogram for malignant neoplasm of breast: Secondary | ICD-10-CM | POA: Diagnosis not present

## 2024-07-16 ENCOUNTER — Ambulatory Visit

## 2024-07-24 ENCOUNTER — Other Ambulatory Visit: Payer: Self-pay | Admitting: Family Medicine

## 2024-07-24 DIAGNOSIS — I10 Essential (primary) hypertension: Secondary | ICD-10-CM

## 2024-11-01 ENCOUNTER — Encounter: Admitting: Family Medicine

## 2024-11-02 ENCOUNTER — Other Ambulatory Visit: Payer: Self-pay | Admitting: Family Medicine

## 2024-11-02 DIAGNOSIS — E782 Mixed hyperlipidemia: Secondary | ICD-10-CM

## 2024-11-18 ENCOUNTER — Encounter: Admitting: Family Medicine
